# Patient Record
Sex: Female | Born: 1970 | Race: White | Hispanic: No | Marital: Single | Smoking: Never smoker
Health system: Southern US, Community
[De-identification: ages and names within clinical notes are randomized; demographics above are authoritative.]

## PROBLEM LIST (undated history)

## (undated) DIAGNOSIS — R519 Headache, unspecified: Secondary | ICD-10-CM

## (undated) DIAGNOSIS — F32A Depression, unspecified: Secondary | ICD-10-CM

## (undated) DIAGNOSIS — C439 Malignant melanoma of skin, unspecified: Secondary | ICD-10-CM

## (undated) DIAGNOSIS — A749 Chlamydial infection, unspecified: Secondary | ICD-10-CM

## (undated) DIAGNOSIS — F319 Bipolar disorder, unspecified: Secondary | ICD-10-CM

## (undated) DIAGNOSIS — F419 Anxiety disorder, unspecified: Secondary | ICD-10-CM

## (undated) DIAGNOSIS — F329 Major depressive disorder, single episode, unspecified: Secondary | ICD-10-CM

## (undated) DIAGNOSIS — F4310 Post-traumatic stress disorder, unspecified: Secondary | ICD-10-CM

## (undated) HISTORY — DX: Bipolar disorder, unspecified: F31.9

## (undated) HISTORY — DX: Major depressive disorder, single episode, unspecified: F32.9

## (undated) HISTORY — DX: Chlamydial infection, unspecified: A74.9

## (undated) HISTORY — DX: Anxiety disorder, unspecified: F41.9

## (undated) HISTORY — DX: Depression, unspecified: F32.A

## (undated) HISTORY — DX: Headache, unspecified: R51.9

## (undated) HISTORY — DX: Post-traumatic stress disorder, unspecified: F43.10

## (undated) HISTORY — DX: Malignant melanoma of skin, unspecified: C43.9

---
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
## 2004-10-01 ENCOUNTER — Encounter: Admission: RE | Admit: 2004-10-01 | Discharge: 2004-10-01 | Payer: Self-pay | Admitting: Family Medicine

## 2008-01-26 HISTORY — PX: REDUCTION MAMMAPLASTY: SUR839

## 2008-07-03 ENCOUNTER — Ambulatory Visit: Payer: Self-pay | Admitting: Family Medicine

## 2008-07-03 DIAGNOSIS — Z85820 Personal history of malignant melanoma of skin: Secondary | ICD-10-CM | POA: Insufficient documentation

## 2008-07-03 DIAGNOSIS — Z8619 Personal history of other infectious and parasitic diseases: Secondary | ICD-10-CM | POA: Insufficient documentation

## 2008-07-03 DIAGNOSIS — R5383 Other fatigue: Secondary | ICD-10-CM

## 2008-07-03 DIAGNOSIS — R5381 Other malaise: Secondary | ICD-10-CM | POA: Insufficient documentation

## 2008-07-17 ENCOUNTER — Ambulatory Visit: Payer: Self-pay | Admitting: Family Medicine

## 2008-07-23 ENCOUNTER — Other Ambulatory Visit: Admission: RE | Admit: 2008-07-23 | Discharge: 2008-07-23 | Payer: Self-pay | Admitting: Family Medicine

## 2008-07-23 ENCOUNTER — Encounter: Payer: Self-pay | Admitting: Family Medicine

## 2008-07-23 ENCOUNTER — Ambulatory Visit: Payer: Self-pay | Admitting: Family Medicine

## 2008-07-24 LAB — CONVERTED CEMR LAB
ALT: 16 units/L (ref 0–35)
AST: 19 units/L (ref 0–37)
Albumin: 3.2 g/dL — ABNORMAL LOW (ref 3.5–5.2)
Alkaline Phosphatase: 42 units/L (ref 39–117)
BUN: 11 mg/dL (ref 6–23)
Basophils Absolute: 0 10*3/uL (ref 0.0–0.1)
Basophils Relative: 0.1 % (ref 0.0–3.0)
Bilirubin, Direct: 0.1 mg/dL (ref 0.0–0.3)
CO2: 29 meq/L (ref 19–32)
Calcium: 8.2 mg/dL — ABNORMAL LOW (ref 8.4–10.5)
Chloride: 107 meq/L (ref 96–112)
Cholesterol: 190 mg/dL (ref 0–200)
Creatinine, Ser: 0.7 mg/dL (ref 0.4–1.2)
Eosinophils Absolute: 0.3 10*3/uL (ref 0.0–0.7)
Eosinophils Relative: 4.1 % (ref 0.0–5.0)
GFR calc non Af Amer: 98.68 mL/min (ref >60)
Glucose, Bld: 98 mg/dL (ref 70–99)
HCT: 38.9 % (ref 36.0–46.0)
HDL: 33.8 mg/dL — ABNORMAL LOW (ref >39.00)
Hemoglobin: 13.2 g/dL (ref 12.0–15.0)
LDL Cholesterol: 147 mg/dL — ABNORMAL HIGH (ref 0–99)
Lymphocytes Relative: 34.5 % (ref 12.0–46.0)
Lymphs Abs: 2.4 10*3/uL (ref 0.7–4.0)
MCHC: 34.1 g/dL (ref 30.0–36.0)
MCV: 88.2 fL (ref 78.0–100.0)
Monocytes Absolute: 0.5 10*3/uL (ref 0.1–1.0)
Monocytes Relative: 6.7 % (ref 3.0–12.0)
Neutro Abs: 3.7 10*3/uL (ref 1.4–7.7)
Neutrophils Relative %: 54.6 % (ref 43.0–77.0)
Platelets: 259 10*3/uL (ref 150.0–400.0)
Potassium: 4.2 meq/L (ref 3.5–5.1)
RBC: 4.41 M/uL (ref 3.87–5.11)
RDW: 12.2 % (ref 11.5–14.6)
Sodium: 141 meq/L (ref 135–145)
TSH: 1.24 microintl units/mL (ref 0.35–5.50)
Total Bilirubin: 0.9 mg/dL (ref 0.3–1.2)
Total CHOL/HDL Ratio: 6
Total Protein: 6.6 g/dL (ref 6.0–8.3)
Triglycerides: 45 mg/dL (ref 0.0–149.0)
VLDL: 9 mg/dL (ref 0.0–40.0)
Vitamin B-12: 282 pg/mL (ref 211–911)
WBC: 6.9 10*3/uL (ref 4.5–10.5)

## 2008-07-30 ENCOUNTER — Encounter (INDEPENDENT_AMBULATORY_CARE_PROVIDER_SITE_OTHER): Payer: Self-pay | Admitting: *Deleted

## 2008-07-30 LAB — CONVERTED CEMR LAB: Pap Smear: NORMAL

## 2008-09-17 ENCOUNTER — Ambulatory Visit: Payer: Self-pay | Admitting: Family Medicine

## 2008-09-17 LAB — CONVERTED CEMR LAB: Rapid Strep: NEGATIVE

## 2009-01-01 ENCOUNTER — Telehealth: Payer: Self-pay | Admitting: Family Medicine

## 2009-01-21 ENCOUNTER — Ambulatory Visit: Payer: Self-pay | Admitting: Family Medicine

## 2009-01-21 DIAGNOSIS — M545 Low back pain, unspecified: Secondary | ICD-10-CM | POA: Insufficient documentation

## 2009-01-21 DIAGNOSIS — H109 Unspecified conjunctivitis: Secondary | ICD-10-CM | POA: Insufficient documentation

## 2009-01-21 DIAGNOSIS — G44209 Tension-type headache, unspecified, not intractable: Secondary | ICD-10-CM | POA: Insufficient documentation

## 2009-01-21 DIAGNOSIS — M542 Cervicalgia: Secondary | ICD-10-CM | POA: Insufficient documentation

## 2009-01-25 HISTORY — PX: REDUCTION MAMMAPLASTY: SUR839

## 2009-01-25 HISTORY — PX: BREAST REDUCTION SURGERY: SHX8

## 2009-01-27 ENCOUNTER — Encounter: Payer: Self-pay | Admitting: Family Medicine

## 2009-02-14 ENCOUNTER — Ambulatory Visit: Payer: Self-pay | Admitting: Family Medicine

## 2009-02-14 DIAGNOSIS — N898 Other specified noninflammatory disorders of vagina: Secondary | ICD-10-CM | POA: Insufficient documentation

## 2009-02-14 LAB — CONVERTED CEMR LAB: KOH Prep: NEGATIVE

## 2009-05-28 ENCOUNTER — Telehealth: Payer: Self-pay | Admitting: Family Medicine

## 2009-06-24 ENCOUNTER — Encounter: Payer: Self-pay | Admitting: Family Medicine

## 2009-09-04 ENCOUNTER — Ambulatory Visit: Payer: Self-pay | Admitting: Family Medicine

## 2009-09-04 DIAGNOSIS — D72829 Elevated white blood cell count, unspecified: Secondary | ICD-10-CM | POA: Insufficient documentation

## 2009-09-04 DIAGNOSIS — B3749 Other urogenital candidiasis: Secondary | ICD-10-CM | POA: Insufficient documentation

## 2009-09-04 LAB — CONVERTED CEMR LAB
BUN: 6 mg/dL (ref 6–23)
Basophils Absolute: 0.1 10*3/uL (ref 0.0–0.1)
Basophils Relative: 0.5 % (ref 0.0–3.0)
CO2: 30 meq/L (ref 19–32)
Calcium: 8.4 mg/dL (ref 8.4–10.5)
Chloride: 103 meq/L (ref 96–112)
Creatinine, Ser: 0.6 mg/dL (ref 0.4–1.2)
Eosinophils Absolute: 0.2 10*3/uL (ref 0.0–0.7)
Eosinophils Relative: 1.5 % (ref 0.0–5.0)
Free T4: 0.91 ng/dL (ref 0.60–1.60)
GFR calc non Af Amer: 112.87 mL/min (ref >60)
Glucose, Bld: 80 mg/dL (ref 70–99)
HCT: 40 % (ref 36.0–46.0)
Hemoglobin: 13.5 g/dL (ref 12.0–15.0)
Hgb A1c MFr Bld: 5.7 % (ref 4.6–6.5)
Lymphocytes Relative: 17.6 % (ref 12.0–46.0)
Lymphs Abs: 2.6 10*3/uL (ref 0.7–4.0)
MCHC: 33.8 g/dL (ref 30.0–36.0)
MCV: 89 fL (ref 78.0–100.0)
Monocytes Absolute: 0.7 10*3/uL (ref 0.1–1.0)
Monocytes Relative: 4.5 % (ref 3.0–12.0)
Neutro Abs: 11.2 10*3/uL — ABNORMAL HIGH (ref 1.4–7.7)
Neutrophils Relative %: 75.9 % (ref 43.0–77.0)
Platelets: 297 10*3/uL (ref 150.0–400.0)
Potassium: 3.6 meq/L (ref 3.5–5.1)
RBC: 4.49 M/uL (ref 3.87–5.11)
RDW: 14.1 % (ref 11.5–14.6)
Sodium: 142 meq/L (ref 135–145)
T3, Free: 3 pg/mL (ref 2.3–4.2)
TSH: 1.12 microintl units/mL (ref 0.35–5.50)
WBC: 14.7 10*3/uL — ABNORMAL HIGH (ref 4.5–10.5)
Whiff Test: POSITIVE

## 2009-09-10 ENCOUNTER — Telehealth: Payer: Self-pay | Admitting: Family Medicine

## 2009-09-10 ENCOUNTER — Ambulatory Visit: Payer: Self-pay | Admitting: Family Medicine

## 2009-09-10 LAB — CONVERTED CEMR LAB
Basophils Absolute: 0.1 10*3/uL (ref 0.0–0.1)
Basophils Relative: 0.7 % (ref 0.0–3.0)
Eosinophils Absolute: 0.3 10*3/uL (ref 0.0–0.7)
Eosinophils Relative: 2.4 % (ref 0.0–5.0)
HCT: 41.1 % (ref 36.0–46.0)
Hemoglobin: 14.1 g/dL (ref 12.0–15.0)
Lymphocytes Relative: 24.5 % (ref 12.0–46.0)
Lymphs Abs: 2.9 10*3/uL (ref 0.7–4.0)
MCHC: 34.4 g/dL (ref 30.0–36.0)
MCV: 88.9 fL (ref 78.0–100.0)
Monocytes Absolute: 0.6 10*3/uL (ref 0.1–1.0)
Monocytes Relative: 5.4 % (ref 3.0–12.0)
Neutro Abs: 7.9 10*3/uL — ABNORMAL HIGH (ref 1.4–7.7)
Neutrophils Relative %: 67 % (ref 43.0–77.0)
Platelets: 311 10*3/uL (ref 150.0–400.0)
RBC: 4.62 M/uL (ref 3.87–5.11)
RDW: 14.6 % (ref 11.5–14.6)
WBC: 11.8 10*3/uL — ABNORMAL HIGH (ref 4.5–10.5)

## 2009-11-21 ENCOUNTER — Ambulatory Visit: Payer: Self-pay | Admitting: Family Medicine

## 2009-11-21 DIAGNOSIS — R42 Dizziness and giddiness: Secondary | ICD-10-CM | POA: Insufficient documentation

## 2009-11-21 DIAGNOSIS — R059 Cough, unspecified: Secondary | ICD-10-CM | POA: Insufficient documentation

## 2009-11-21 DIAGNOSIS — R05 Cough: Secondary | ICD-10-CM

## 2010-02-26 NOTE — Assessment & Plan Note (Signed)
Summary: FEELS WEAK, NO APPETITE   Vital Signs:  Patient profile:   40 year old female Height:      61.75 inches Weight:      209.2 pounds BMI:     38.71 Temp:     97.8 degrees F oral Pulse rate:   76 / minute Pulse rhythm:   118regular BP sitting:   118 / 72  (left arm) Cuff size:   large  Vitals Entered By: Benny Lennert CMA Duncan Dull) (September 04, 2009 8:42 AM) Is Patient Diabetic? No   History of Present Illness: Chief complaint feels weak and no appetite also ? yeast infection  40 year old female:  Had a breast reduction some time,   Works as a Oncologist.   Heat is outside. Has lost ten pounds in the last three or for weeks.  When eating, will upset her stomache.  Has had some shaky hands and legs, has had longstanding.  Drinking a lot  the patient feels shaky, is tired, has lost weight over the last 2 or 3 weeks. She also is urinating more than normally.  Craving soft drinks now.   had gestational diabets.   h/o : GDM  FH: thyroid.    she also complains of having some itchiness and discomfort in and around in her thigh and buttock areas with some increased watery stools recently. No vaginal discharge, but some discomfort in think that she may have the onset of a discharge.    Allergies (verified): No Known Drug Allergies  Past History:  Past medical, surgical, family and social histories (including risk factors) reviewed, and no changes noted (except as noted below).  Past Surgical History: Reviewed history from 07/03/2008 and no changes required. eye surgery for strabismus C-section  Family History: Reviewed history from 07/03/2008 and no changes required. mother: hypothyroid, kidney disease..had partial kidney removed age 29 father: died young age in accident PGM: CVA, MI PGF: DM, ? lung cancer MGM: lung cancer  Social History: Reviewed history from 07/03/2008 and no changes required. [employer] systems officer In long term monogomous  relationship, fiance Single 1 child, healthy Never Smoked Alcohol use-yes, monthy drinks 4 drinks Drug use-no Regular exercise-no Diet: fruits and veggies, fish  Review of Systems       as above  Physical Exam  General:  GEN: Well-developed,well-nourished,in no acute distress; alert,appropriate and cooperative throughout examination HEENT: Normocephalic and atraumatic without obvious abnormalities. No apparent alopecia or balding. Ears, externally no deformities PULM: Breathing comfortably in no respiratory distress EXT: No clubbing, cyanosis, or edema PSYCH: Normally interactive. Cooperative during the interview. Pleasant. Friendly and conversant. Not anxious or depressed appearing. Normal, full affect.  Lungs:  Normal respiratory effort, chest expands symmetrically. Lungs are clear to auscultation, no crackles or wheezes. Heart:  Normal rate and regular rhythm. S1 and S2 normal without gallop, murmur, click, rub or other extra sounds. Genitalia:  normal external genitalia, with some evidence of a flat macular rash externally. Positive whiff test.   Impression & Recommendations:  Problem # 1:  FATIGUE (ICD-780.79) likely multifactorial, could be endocrine base. Could be anemia, may all be due to heat, when the patient works outside all day.  Orders: Venipuncture (04540) TLB-BMP (Basic Metabolic Panel-BMET) (80048-METABOL) TLB-CBC Platelet - w/Differential (85025-CBCD) TLB-TSH (Thyroid Stimulating Hormone) (84443-TSH) TLB-A1C / Hgb A1C (Glycohemoglobin) (83036-A1C) TLB-T3, Free (Triiodothyronine) (84481-T3FREE) TLB-T4 (Thyrox), Free [phone])  Problem # 2:  VAGINAL DISCHARGE (ICD-623.5)  tree with oral Flagyl, bacterial vaginosis  Orders: Wet Prep (29562ZH)  Problem # 3:  CANDIDIASIS OF OTHER UROGENITAL SITES (ICD-112.2) Assessment: New topical nystatin  Complete Medication List: 1)  B Complex Tabs (B complex vitamins) .... As directed 2)  Multivitamins Tabs  (Multiple vitamin) .... Otc as directed. 3)  Nystatin 100000 Unit/gm Crea (Nystatin) .... Apply three times a day as directed 4)  Metronidazole 500 Mg Tabs (Metronidazole) .Marland Kitchen.. 1 by mouth two times a day Prescriptions: METRONIDAZOLE 500 MG  TABS (METRONIDAZOLE) 1 by mouth two times a day  #14 x 0   Entered and Authorized by:   Hannah Beat MD   Signed by:   Hannah Beat MD on 09/04/2009   Method used:   [facility]        [facility] [facility]       [address]       [place], [place]  [address]       Ph: [phone] or [phone]       Fax: [phone]   RxID:   [id number] NYSTATIN 100000 UNIT/GM CREA (NYSTATIN) Apply three times a day as directed  #45 grams x 0   Entered and Authorized by:   Hannah Beat MD   Signed by:   Hannah Beat MD on 09/04/2009   Method used:   [facility]        [id number]       [address]       [place], [place]  [address]       Ph: [phone] or [phone]       Fax: [phone]   RxID:   [id number]   Current Allergies (reviewed today): No known allergies   Laboratory Results    [facility] Source: vagina WBC/hpf: 1-5 Bacteria/hpf: rare Clue cells/hpf: many  Positive whiff Yeast/hpf: none Trichomonas/hpf: none

## 2010-02-26 NOTE — Letter (Signed)
Summary: Generic Letter  [place] [facility]  [address] [place], [place] [address]   Phone: [phone]  Fax: [phone]    01/27/2009  [facility] [address] [place], [place]  [address]    To Whom It May Concern;   Ms. Janice Ali is a 40 year old female patient of mine with tension headaches, chronic neck pain and chronic low back pain who is interested in breast reduction surgery. I believe many of her chronic health issues will be vastly improved if not resolved following a breast reduction.      Her current bra size is G or H. She has trouble finding bras that provide support. Her exercise is limited due to lack of breast support.   Please let me know if you have any further questions regarding this patient.      Sincerely,   Kerby Nora MD

## 2010-02-26 NOTE — Assessment & Plan Note (Signed)
Summary: COUGH,DIZZY/CLE   Vital Signs:  Patient profile:   40 year old female Weight:      205.25 pounds Temp:     98.4 degrees F oral Pulse rate:   64 / minute Pulse (ortho):   84 / minute Pulse rhythm:   regular BP sitting:   112 / 80  (left arm) BP standing:   116 / 80 Cuff size:   large  Vitals Entered By: Selena Batten Dance CMA Duncan Dull) (November 21, 2009 3:43 PM)  Serial Vital Signs/Assessments:  Time      Position  BP       Pulse  Resp  Temp     By 4:09 PM   Lying LA  118/82   86                    Kim Dance CMA (AAMA) 4:09 PM   Sitting   116/80   88                    Kim Dance CMA (AAMA) 4:09 PM   Standing  116/80   84                    Kim Dance CMA (AAMA)  CC: Cough x2-3 days   History of Present Illness: CC: cough, dizzy  Several week h/o dizziness mainly when reaching up like getting something out of cupboard.  Also noticing when standing up quickly.  Feels lightheaded, off balanced.  Lasts a second.  No vertigo.  No LOC.  No HA, vision changes, CP/tightness.  Cough for 2-3 days - deep, dry, intermittent.  worse at night, associated with AC on.  hasn't tried anything for this yet.  No congestion, RN, sinus pressure.  No PNDrip.  no fevers/chills, abd pain, n/v, rashes, myalgias or arthralgias.  Smoking 1/2 ppd.  Smoking started a few months ago.  No h/o allergies, asthma.  No sick contacts at home.  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  Neuro - Dr. Neale Burly for headaches.  Current Medications (verified): 1)  B Complex  Tabs (B Complex Vitamins) .... As Directed 2)  Multivitamins   Tabs (Multiple Vitamin) .... Otc As Directed.  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 No apparent alopecia or balding. Eyes:  No corneal or conjunctival inflammation noted. EOMI. Perrla.  Ears:  clear B TMS Nose:  no mucosal pallor.   Mouth:  slight dry mm. Neck:  No deformities, masses, or tenderness noted.  no bruits auscultated over entire carotids Lungs:  Normal respiratory effort, chest expands symmetrically. Lungs are clear to auscultation, no crackles or wheezes. Heart:  Normal rate and regular rhythm. S1 and S2 normal without gallop, murmur, click, rub or other extra sounds. Pulses:  2+ rad pulses, 2+ carotid pulses Extremities:  no edema Neurologic:  No cranial nerve deficits  noted. Station and gait are normal. DTRs are symmetrical throughout. Sensory, motor and coordinative functions appear intact.  no dysdiadochokinesia, negative romberg, no pronator drift, normal finger to nose, heel to shin, walks without imbalance, able to heel and toe walk, unable to reproduce sxs, no nystagmus. Skin:  Intact without suspicious lesions or rashes Psych:  full affect   Impression & Recommendations:  Problem # 1:  COUGH (ICD-786.2) doesnt sound viral, allergic, lungs clear.  ? if irritant from envirnmental exposure.  advised to wash beddings, keep AC and fans off at night.  could try antihistamine.  return if not improving.  pt declines cough suppressant for now.  Problem # 2:  DIZZINESS (ICD-780.4) unclear etiology.  neuro exam nonfocal today.  not vertigo or presyncope.  advised if worsening or if persisting to return for further evaluation.  doubt brainstem cyst  causing this, but may need to take closer look if not resolved.  ? dehydration as slightly dry on exam today.  encouraged fluid intake.  orthostatics negative.  Complete Medication List: 1)  B Complex Tabs (B complex vitamins) .... As directed 2)  Multivitamins Tabs (Multiple vitamin) .... Otc as directed.  Other Orders: Flu Vaccine 61yrs + (14782) Admin 1st Vaccine (95621)  Patient Instructions: 1)  For cough could be reaction to something in your environment at night.  Could try antihistamine.  Be aware of anything that could be causing it and remove it (?dander). 2)  For dizziness - if not improving, or if worsening please return to be seen.  I'm not sure what is causing this today. 3)  to meet you, call clinic with questions.   Orders Added: 1)  Est. Patient Level III [30865] 2)  Flu Vaccine 1yrs + [90658] 3)  Admin 1st Vaccine [90471]   Immunizations Administered:  Influenza Vaccine # 1:    Vaccine Type: Fluvax 3+    Site: right deltoid    Mfr: GlaxoSmithKline    Dose: 0.5 ml    Route: IM    Given by: Selena Batten Dance CMA (AAMA)    Exp. Date: 07/25/2010    Lot #: [id number]    VIS given: 08/19/09 version given November 21, 2009.  Flu Vaccine Consent Questions:    Do you have a history of severe allergic reactions to this vaccine? no    Any prior history of allergic reactions to egg and/or gelatin? no    Do you have a sensitivity to the preservative Thimersol? no    Do you have a past history of Guillan-Barre Syndrome? no    Do you currently have an acute febrile illness? no    Have you ever had a severe reaction to latex? no    Vaccine information given and explained to patient? yes    Are you currently pregnant? no   Immunizations Administered:  Influenza Vaccine # 1:    Vaccine Type: Fluvax 3+    Site: right deltoid    Mfr: GlaxoSmithKline    Dose: 0.5 ml    Route: IM    Given by: Janee Morn CMA (AAMA)    Exp. Date: 07/25/2010    Lot #: [id number]    VIS given:  08/19/09 version given November 21, 2009.  Current Allergies (reviewed today): No known allergies     Prevention & Chronic Care Immunizations   Influenza vaccine: Fluvax 3+  (11/21/2009)   Influenza vaccine due: 09/26/2010    Tetanus booster: 01/25/2006: given   Tetanus booster due: 01/26/2016    Pneumococcal  vaccine: Not documented  Other Screening   Pap smear: normal  (07/30/2008)   Pap smear due: 07/30/2009    Mammogram: Not documented   Smoking status: never  (07/03/2008)  Lipids   Total Cholesterol: 190  (07/17/2008)   LDL: 147  (07/17/2008)   LDL Direct: Not documented   HDL: 33.80  (07/17/2008)   Triglycerides: 45.0  (07/17/2008)

## 2010-02-26 NOTE — Progress Notes (Signed)
Summary: wants referral regarding headaches  Phone Note Call from Patient Call back at Home Phone [phone] Call back at Work Phone [phone]   Caller: Patient Call For: Kerby Nora MD Summary of Call: Patient says that she has now had the breast reduction and her back problems have resolved, but she is still having very intense headaches and having trouble with speech. She is asking if she can get a referral to see why she continues to have the headaches. Pref[place]. Please advise.  Initial call taken by: Melody Comas,  May 28, 2009 3:48 PM  Follow-up for Phone Call        referral sent to [facility]. Follow-up by: Kerby Nora MD,  May 30, 2009 12:58 PM  Additional Follow-up for Phone Call Additional follow up Details #1::        Dr Amelia Jo on 06/24/2009 at 10:15am. Additional Follow-up by: Carlton Adam,  Jun 02, 2009 10:21 AM

## 2010-02-26 NOTE — Progress Notes (Signed)
Summary: regarding blood work  Phone Note Call from Patient Call back at Work Phone [phone]   Caller: Patient Call For: Dr. Patsy Lager Summary of Call: Pt had repeat blood work done this morning, including a pheripheral smear, and she is asking what you are looking for, or trying to rule out.  Please advise. Initial call taken by: Lowella Petties CMA,  September 10, 2009 9:48 AM  Follow-up for Phone Call        Often people have a transient elevation of white blood cell count.  This is benign. If is trending lower, then that is reassuring. Peripheral smear, the pathologist looks at the blood cells, manually counts all, and is a far more accurate way to look at the types of blood cells and platelets.  If normal, nothing more needs to be done.  Ultimately, you do these things to make sure something else bad is happening: like a form of leukemia. You have to double check. Follow-up by: Hannah Beat MD,  September 10, 2009 4:42 PM  Additional Follow-up for Phone Call Additional follow up Details #1::        LMOM  Lowella Petties CMA  September 10, 2009 5:05 PM [facility] at work number.           Lowella Petties CMA  September 11, 2009 10:24 AM  Advised pt.  Her results are in chart, can you please review these and advise?  Thanks. Additional Follow-up by: Lowella Petties CMA,  September 11, 2009 11:06 AM    Additional Follow-up for Phone Call Additional follow up Details #2::    peripheral smear is not back. CBC is trending down, WBC approaching upper level of normal which is reassuring.   Follow-up by: Hannah Beat MD,  September 11, 2009 11:29 AM  Additional Follow-up for Phone Call Additional follow up Details #3:: Details for Additional Follow-up Action Taken: Advised pt, advised her that we will call her back when all results are in.            Lowella Petties CMA  September 11, 2009 11:34 AM    Appended Document: regarding blood work Dr.Kalissa Grays spoke to patient about results.Consuello Masse CMA

## 2010-02-26 NOTE — Letter (Signed)
Summary: Results Follow up Letter  [place] [facility]  [address] [place], [place] [address]   Phone: [phone]  Fax: [phone]    07/30/2008 MRN: [id number]    [facility] [address] [place], [place]  [address]    Dear Ms. [facility]ACE,  The following are the results of your recent test(s):  Test         Result    Pap Smear:        Normal __X___  Not Normal _____ Comments:  Yearly follow up is recommended. ______________________________________________________ Cholesterol: LDL(Bad cholesterol):         Your goal is less than:         HDL (Good cholesterol):       Your goal is more than: Comments:  ______________________________________________________ Mammogram:        Normal _____  Not Normal _____ Comments:  ___________________________________________________________________ Hemoccult:        Normal _____  Not normal _______ Comments:    _____________________________________________________________________ Other Tests:  STD's test negative.    We routinely do not discuss normal results over the telephone.  If you desire a copy of the results, or you have any questions about this information we can discuss them at your next office visit.   Sincerely,     Excell Seltzer, M.D.  AEB:lsf

## 2010-02-26 NOTE — Assessment & Plan Note (Signed)
Summary: F/U FROM LAST VISIT/CLE   Vital Signs:  Patient profile:   40 year old female Height:      61.75 inches Weight:      226.6 pounds BMI:     41.93 Temp:     98.2 degrees F oral Pulse rate:   80 / minute Pulse rhythm:   regular BP sitting:   110 / 80  (left arm) Cuff size:   large  Vitals Entered By: Benny Lennert CMA Duncan Dull) (February 14, 2009 3:14 PM)  History of Present Illness: Chief complaint wet prep ? fungal infection  Vaginal odor during menses..menses resolved, but still odor. Clear discharge, no itching. Rare abdominal pain, No N/V.   B eyes, right worse..mattering, crusty yellow discharge, occ watering, unconfortable. not ithcy. no sneezing, no hx of allergies.    Problems Prior to Update: 1)  Conjunctivitis, Viral  (ICD-077.99) 2)  Neck Pain, Chronic  (ICD-723.1) 3)  Low Back Pain, Chronic  (ICD-724.2) 4)  Headache, Tension  (ICD-307.81) 5)  Preventive Health Care  (ICD-V70.0) 6)  Screening For Lipoid Disorders  (ICD-V77.91) 7)  Fatigue  (ICD-780.79) 8)  Chlamydial Infection, Hx of  (ICD-V12.09) 9)  Hx of Melanoma  (ICD-172.9)  Current Medications (verified): 1)  B Complex  Tabs (B Complex Vitamins) .... As Directed 2)  Multivitamins   Tabs (Multiple Vitamin) .... Otc As Directed. 3)  Neomycin-Polymyxin-Hc 3.5-10000-1 Soln (Neomycin-Polymyxin-Hc) .Marland Kitchen.. 1 Gtt in Each Eye Three Times A Day X 5 Days 4)  Fluconazole 150 Mg Tabs (Fluconazole) .Marland Kitchen.. 1 Tab By Mouth X 1 Day  Allergies (verified): No Known Drug Allergies  Past History:  Past medical, surgical, family and social histories (including risk factors) reviewed, and no changes noted (except as noted below).  Past Surgical History: Reviewed history from 07/03/2008 and no changes required. eye surgery for strabismus C-section  Family History: Reviewed history from 07/03/2008 and no changes required. mother: hypothyroid, kidney disease..had partial kidney removed age 86 father: died young age in  accident PGM: CVA, MI PGF: DM, ? lung cancer MGM: lung cancer  Social History: Reviewed history from 07/03/2008 and no changes required. 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 Eyes:  Complains of eye irritation; denies double vision. ENT:  Complains of nasal congestion and postnasal drainage; denies earache and sinus pressure. CV:  Denies chest pain or discomfort. Resp:  Denies shortness of breath. GI:  Denies abdominal pain.  Physical Exam  General:  obses appearing female inNAd Eyes:  B slight erythema, no discharge.  Ears:  clear B TMS Nose:  nasal discharge, no mucosal pallor.   Mouth:  MMM Genitalia:  normal introitus and no external lesions, brownish vaginal discharge, no erythema   Impression & Recommendations:  Problem # 1:  CONJUNCTIVITIS, BILATERAL (ICD-372.30) If viral should have improved by now..treat with antibacterial steroid combo. TAke zyrtec for possible allergies. Call if not continue to improve.   Problem # 2:  VAGINAL DISCHARGE (ICD-623.5)  Rare yeast..no evidence of bacterial infection. Treat with diflucan x 1 . Call if not improving.   Orders: Wet Prep (51884ZY)  Complete Medication List: 1)  B Complex Tabs (B complex vitamins) .... As directed 2)  Multivitamins Tabs (Multiple vitamin) .... Otc as directed. 3)  Neomycin-polymyxin-hc 3.5-10000-1 Soln (Neomycin-polymyxin-hc) .Marland Kitchen.. 1 gtt in each eye three  times a day x 5 days 4)  Fluconazole 150 Mg Tabs (Fluconazole) .Marland Kitchen.. 1 tab by mouth x 1 day  Patient Instructions: 1)  Zyrtec for allergies. Eye drops as directed. 2)  Antifungal x 1 . Prescriptions: FLUCONAZOLE 150 MG TABS (FLUCONAZOLE) 1 tab by mouth x 1 day  #1 x 0   Entered and Authorized by:   Kerby Nora MD    Signed by:   Kerby Nora MD on 02/14/2009   Method used:   [facility] [id number]       [address]       [place], [place]  [address]       Ph: [phone] or [phone]       Fax: [phone]   RxID:   [id number] NEOMYCIN-POLYMYXIN-HC 3.5-10000-1 SOLN (NEOMYCIN-POLYMYXIN-HC) 1 gtt in each eye three times a day x 5 days  #1 x 0   Entered and Authorized by:   Kerby Nora MD   Signed by:   Kerby Nora MD on 02/14/2009   Method used:   [facility]        [facility] [id number]       [address]       [place], [place]  [address]       Ph: [phone] or [phone]       Fax: [phone]   RxID:   [id number]   Current Allergies (reviewed today): No known allergies   Laboratory Results    [place]/KOH Source: vag WBC/hpf rare Bacteria/hpf many Clue cells/hpf none Yeast/hpf occ KOH Negative Trichomonas/hpf neg

## 2010-02-26 NOTE — Progress Notes (Signed)
  Phone Note Call from Patient Call back at Home Phone [phone]   Caller: Patient Call For: Kerby Nora MD Summary of Call: Patient said that you told her to check with insurance about breast reduction and she did they will cover this but we have to find a surgeon that takes her insuranse Initial call taken by: Benny Lennert CMA Duncan Dull),  January 01, 2009 11:25 AM  Follow-up for Phone Call        Recommend having the patient defining this, not our staff. Follow-up by: Hannah Beat MD,  January 01, 2009 5:35 PM  Additional Follow-up for Phone Call Additional follow up Details #1::        Pt can let us know if she needs referral to specific surgeon once she has checked with insurance.  Additional Follow-up by: Kerby Nora MD,  January 02, 2009 11:21 AM    Additional Follow-up for Phone Call Additional follow up Details #2::    Patient advised.Consuello Masse CMA  Follow-up by: Benny Lennert CMA Duncan Dull),  January 02, 2009 12:42 PM

## 2010-02-26 NOTE — Consult Note (Signed)
Summary: [facility]  [facility]   Imported By: Lennie Odor 07/04/2009 15:11:22  _____________________________________________________________________  External Attachment:    Type:   Image     Comment:   External Document

## 2010-02-26 NOTE — Assessment & Plan Note (Signed)
Summary: STREP TEST/DLO   Vital Signs:  Patient profile:   40 year old female Height:      61.75 inches Weight:      223.50 pounds BMI:     41.36 Temp:     99 degrees F oral Pulse rate:   96 / minute Pulse rhythm:   regular BP sitting:   114 / 70  (left arm) Cuff size:   large  Vitals Entered By: Lewanda Rife LPN (September 17, 2008 12:08 PM)  CC:  ? strep throat and sorethroat and diarrhea.  History of Present Illness: Here for ST and diarrhea --diarrhea x 1wk--last stool today--loose now, eating regular diet-- --ST x 2d-- --exposed to strep 2 wks ago   Allergies (verified): No Known Drug Allergies  Review of Systems      See HPI  Physical Exam  General:  alert, well-developed, well-nourished, and well-hydrated.  NAD Eyes:  pupils equal, pupils round, and no injection.   Ears:  TMs retracted with increased fluid Nose:  no airflow obstruction, mucosal erythema, and mucosal edema.  sinuses +,- Mouth:  no exudates and pharyngeal erythema.   Lungs:  moist cough only, no crackles and no wheezes.   Neurologic:  alert & oriented X3 and gait normal.   Cervical Nodes:  1 cm tonsilar nodes, no anterior cervical adenopathy and no posterior cervical adenopathy.   Psych:  normally interactive and good eye contact.     Impression & Recommendations:  Problem # 1:  URI (ICD-465.9) Assessment New continue comfort care measures: increase po fluids, rest, tylenol or IBP as needed will start on zpac gargle freq see back if not improved 5-7d diet should be bland, low fat until stool solid again  Complete Medication List: 1)  Vitamin E 600 Unit Caps (Vitamin e) .... Otc as directed. 2)  Multivitamins Tabs (Multiple vitamin) .... Otc as directed. 3)  Zithromax Z-pak 250 Mg Tabs (Azithromycin) .... Use as directed Prescriptions: ZITHROMAX Z-PAK 250 MG TABS (AZITHROMYCIN) use as directed  #1 x 0   Entered and Authorized by:   Gildardo Griffes FNP   Signed by:   Gildardo Griffes FNP on 09/17/2008   Method used:   [id number]       [address]       [place], [place]  [address]       Ph: [phone] or [phone]       Fax: [phone]   RxID:   [id number]   Current Allergies (reviewed today): No known allergies   Laboratory Results  Date/Time Received: September 17, 2008 12:10 PM  Date/Time Reported: September 17, 2008 12:10 PM   Other Tests  Rapid Strep: negative  Kit Test Internal QC: Positive   (Normal Range: Negative)

## 2010-02-26 NOTE — Assessment & Plan Note (Signed)
Summary: new patient, has migraines/bir   Vital Signs:  Patient profile:   40 year old female Height:      61.75 inches Weight:      217.0 pounds BMI:     40.16 Temp:     97.6 degrees F oral Pulse rate:   72 / minute Pulse rhythm:   regular BP sitting:   110 / 80  (left arm) Cuff size:   large  Vitals Entered By: Benny Lennert CMA (July 03, 2008 10:23 AM)  History of Present Illness: Chief complaint new patient to be established   Seen in 01/2008 for vaginal odor diagnosed with yeast infection chlamydia.  Has not had pap smear in a while, overdue for breast exam. Now vaginal odor has returned.  Some urinary frequency. Occ low abdominal pinching.   Some decreased energy, increased stress.  Weight gsain...but working on weight loss.  Preventive Screening-Counseling & Management  Alcohol-Tobacco     Smoking Status: never  Caffeine-Diet-Exercise     Does Patient Exercise: no      Drug Use:  no.    Current Medications (verified): 1)  None  Allergies (verified): No Known Drug Allergies  Past History:  Past medical, surgical, family and social histories (including risk factors) reviewed, and no changes noted (except as noted below).  Past Surgical History: eye surgery for strabismus C-section  Family History: Reviewed history and no changes required. mother: hypothyroid, kidney disease..had partial kidney removed age 64 father: died young age in accident PGM: CVA, MI PGF: DM, ? lung cancer MGM: lung cancer  Social History: Reviewed history and no changes required. 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 CV:  Denies chest pain or discomfort. Resp:  Denies  shortness of breath. GI:  Complains of constipation; denies abdominal pain and diarrhea. GU:  Denies abnormal vaginal bleeding and dysuria; menses last week. Neuro:  hands fall asleep a lot. Psych:  Complains of easily angered and irritability; denies anxiety, depression, and easily tearful.  Physical Exam  General:  overwieght appearing female in NAD Ears:  External ear exam shows no significant lesions or deformities.  Otoscopic examination reveals clear canals, tympanic membranes are intact bilaterally without bulging, retraction, inflammation or discharge. Hearing is grossly normal bilaterally. Nose:  External nasal examination shows no deformity or inflammation. Nasal mucosa are pink and moist without lesions or exudates. Mouth:  Oral mucosa and oropharynx without lesions or exudates.  Teeth in good repair. MMM Neck:  no carotid bruit or thyromegaly no cervical or supraclavicular lymphadenopathy  Lungs:  Normal respiratory effort, chest expands symmetrically. Lungs are clear to auscultation, no crackles or wheezes. Heart:  Normal rate and regular rhythm. S1 and S2 normal without gallop, murmur, click, rub or other extra sounds. Abdomen:  Bowel sounds positive,abdomen soft and mildly tender in LLQ without masses, organomegaly or hernias noted.no rebound tenderness Pulses:  R and L posterior tibial pulses are full and equal bilaterally  Extremities:  no edema Skin:  Intact without suspicious lesions or rashes Psych:  Cognition and judgment appear intact. Alert and cooperative with normal attention span and concentration. No apparent delusions, illusions, hallucinations   Impression & Recommendations:  Problem # 1:  FATIGUE (ICD-780.79) Will evaluate for  secondary causes with labs given other symptomotology.   Problem # 2:  ABDOMINAL PAIN, LEFT LOWER QUADRANT (ICD-789.04) Mild...? over ovary due to recent menses or from constipation. Increase fluids, fiber and exercise.   Problem # 3:   Preventive Health Care (ICD-V70.0) Assessment: Comment Only Due for CPE.Marland KitchenNeed chart ASAP/STAT to check pap and eval vaginal odor. Liekly need to retest GC/CHlam again on pap. Refused other STD testing.   Patient Instructions: 1)  Scheduled CPE, PAP in next  2)  Fasting lipids, CMET Dx v77.91, TSh, cbc, B12 789.09  Prior Medications (reviewed today): None Current Allergies (reviewed today): No known allergies   TD Result Date:  01/25/2006 TD Result:  given TD Next Due:  10 yr

## 2010-02-26 NOTE — Assessment & Plan Note (Signed)
Summary: CPX/PAP/DLO   Vital Signs:  Patient profile:   40 year old female LMP:     07/11/2008 Height:      61.75 inches Weight:      217 pounds BMI:     40.16 Temp:     99.4 degrees F oral Pulse rate:   76 / minute Pulse rhythm:   regular BP sitting:   108 / 82  (left arm) Cuff size:   large  Vitals Entered By: Delilah Shan (July 23, 2008 3:35 PM) CC: CPX - Pap LMP (date): 07/11/2008  years   days  Enter LMP: 07/11/2008   History of Present Illness: Has history of GC /Chlam in past...has had some recent vaginal discharge..interested in rechecking GC/chlam. No further abdominal pain and only minimal discharge.   Has continued fatigue, but some improvement.   Problems Prior to Update: 1)  Screening For Lipoid Disorders  (ICD-V77.91) 2)  Abdominal Pain, Left Lower Quadrant  (ICD-789.04) 3)  Fatigue  (ICD-780.79) 4)  Chlamydial Infection, Hx of  (ICD-V12.09) 5)  Hx of Melanoma  (ICD-172.9)  Current Medications (verified): 1)  None  Allergies (verified): No Known Drug Allergies  Past History:  Past medical, surgical, family and social histories (including risk factors) reviewed, and no changes noted (except as noted below).  Past Surgical History: Reviewed history from 07/03/2008 and no changes required. eye surgery for strabismus C-section  Family History: Reviewed history from 07/03/2008 and no changes required. mother: hypothyroid, kidney disease..had partial kidney removed age 36 father: died young age in accident PGM: CVA, MI PGF: DM, ? lung cancer MGM: lung cancer  Social History: Reviewed history from 07/03/2008 and no changes required. 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 CV:  Denies chest pain or discomfort. Resp:  Denies  shortness of breath. GI:  Denies abdominal pain, bloody stools, constipation, and diarrhea. GU:  Denies abnormal vaginal bleeding, discharge, and dysuria.  Physical Exam  General:  morbidly obeses female in NAD Eyes:  No corneal or conjunctival inflammation noted. EOMI. Perrla. Funduscopic exam benign, without hemorrhages, exudates or papilledema. Vision grossly normal. Ears:  External ear exam shows no significant lesions or deformities.  Otoscopic examination reveals clear canals, tympanic membranes are intact bilaterally without bulging, retraction, inflammation or discharge. Hearing is grossly normal bilaterally. Nose:  External nasal examination shows no deformity or inflammation. Nasal mucosa are pink and moist without lesions or exudates. Mouth:  Oral mucosa and oropharynx without lesions or exudates.  Teeth in good repair. Neck:  no carotid bruit or thyromegaly no cervical or supraclavicular lymphadenopathy  Breasts:  large pendulous breasts, no masses palpated, no nipple or skin abnormalities Lungs:  Normal respiratory effort, chest expands symmetrically. Lungs are clear to auscultation, no crackles or wheezes. Heart:  Normal rate and regular rhythm. S1 and S2 normal without gallop, murmur, click, rub or other extra sounds. Abdomen:  Bowel sounds positive,abdomen soft and non-tender without masses, organomegaly or hernias noted. Genitalia:  Pelvic Exam:        External: normal female genitalia without lesions or masses        Vagina: normal without lesions or masses        Cervix: normal without lesions or masses        Adnexa: normal bimanual exam without masses or fullness  Uterus: normal by palpation        Pap smear: performed Pulses:  R and L posterior tibial pulses are full and equal bilaterally  Extremities:  no edema   Impression & Recommendations:  Problem # 1:  Preventive Health Care (ICD-V70.0) Reviewed preventive care protocols, scheduled due services, and  updated immunizations. Encouraged exercise, weight loss, healthy eating habits.  Start calcium and vit D.   Problem # 2:  FATIGUE (ICD-780.79) Increase vit B12 . Work on [employer] and weight loss. Otherwise labs normal.   Patient Instructions: 1)  Calcium and vit D 600 mg/400 International Units once or twice daily. 2)  Start B12 in multivitamin or separate supplement.   Prior Medications (reviewed today): None Current Allergies (reviewed today): No known allergies

## 2016-01-12 ENCOUNTER — Encounter: Payer: Self-pay | Admitting: Primary Care

## 2016-01-12 ENCOUNTER — Ambulatory Visit (INDEPENDENT_AMBULATORY_CARE_PROVIDER_SITE_OTHER): Payer: PRIVATE HEALTH INSURANCE | Admitting: Primary Care

## 2016-01-12 VITALS — BP 102/70 | HR 74 | Temp 97.4°F | Ht 61.0 in | Wt 183.0 lb

## 2016-01-12 DIAGNOSIS — D229 Melanocytic nevi, unspecified: Secondary | ICD-10-CM | POA: Diagnosis not present

## 2016-01-12 DIAGNOSIS — Z85820 Personal history of malignant melanoma of skin: Secondary | ICD-10-CM

## 2016-01-12 DIAGNOSIS — Z1231 Encounter for screening mammogram for malignant neoplasm of breast: Secondary | ICD-10-CM

## 2016-01-12 DIAGNOSIS — F3177 Bipolar disorder, in partial remission, most recent episode mixed: Secondary | ICD-10-CM | POA: Diagnosis not present

## 2016-01-12 DIAGNOSIS — Z1239 Encounter for other screening for malignant neoplasm of breast: Secondary | ICD-10-CM

## 2016-01-12 DIAGNOSIS — F319 Bipolar disorder, unspecified: Secondary | ICD-10-CM | POA: Insufficient documentation

## 2016-01-12 NOTE — Assessment & Plan Note (Signed)
Numerous Nevi. Nevi in question appears benign, but considering history will refer. Referral sent to dermatology for full body check.

## 2016-01-12 NOTE — Patient Instructions (Signed)
Schedule your mammogram with the [facility] in [place].  Please schedule a physical with me at your convenience. You may also schedule a lab only appointment 3-4 days prior. We will discuss your lab results in detail during your physical.  Stop by the front desk and speak with either Rosaria Ferries or Robin regarding your referral to Dermatology.  It was a pleasure to meet you today! Please don't hesitate to call me with any questions. Welcome to [facility]!

## 2016-01-12 NOTE — Progress Notes (Signed)
   Subjective:    Patient ID: Janice Ali, female    DOB: 12-29-1970, 46 y.o.   MRN: [id number]  HPI  Janice Ali is a 45 year old female who presents today to establish care and discuss the problems mentioned below. Will obtain old records. Her last physical was several years ago.   1) Anxiety and Depression, PTSD, Bipolar Disorder: Diagnosed since August 2017. Currently managed on Paxil 40 mg, Geodon 40 mg, Alprazolam 0.25 mg, and Trazodone 50 mg. She is currently following with [facility] monthly and a therapist in [place] once weekly. Overall she feels well managed.  2) Nevi: History of Melanoma and was once following with Dermatology. She has noticed several newer moles; one to her right lower extremity and the other to her right lower extremity that has been present for several weeks. She has noticed changes in size. She would like to see a dermatologist for further evaluation. She denies unexplained weight loss, open lesions, itching, pain.  Review of Systems  Constitutional: Negative for unexpected weight change.  Respiratory: Negative for shortness of breath.   Cardiovascular: Negative for chest pain.  Skin:       Nevi  Psychiatric/Behavioral: Negative for sleep disturbance and suicidal ideas. The patient is not nervous/anxious.        Past Medical History:  Diagnosis Date  . Anxiety and depression   . Bipolar disorder ([place])   . Post traumatic stress disorder (PTSD)      Social History   Social History  . Marital status: Divorced    Spouse name: N/A  . Number of children: N/A  . Years of education: N/A   Occupational History  . Not on file.   Social History Main Topics  . Smoking status: Never Smoker  . Smokeless tobacco: Not on file  . Alcohol use Yes  . Drug use: Unknown  . Sexual activity: Not on file   Other Topics Concern  . Not on file   Social History Narrative   Single.   1 son.   Works in [employer].   Enjoys gardening.     Past  Surgical History:  Procedure Laterality Date  . BREAST REDUCTION SURGERY Bilateral 2011  . CESAREAN SECTION    . EYE SURGERY  1977    Family History  Problem Relation Age of Onset  . Hypothyroidism Mother   . Hypertension Mother   . Lung cancer Maternal Grandmother     Allergies  Allergen Reactions  . Codeine Itching  . Latex Itching    No current outpatient prescriptions on file prior to visit.   No current facility-administered medications on file prior to visit.     BP 102/70   Pulse 74   Temp 97.4 F (36.3 C) (Oral)   Ht 5\' 1"  (1.549 m)   Wt 183 lb (83 kg)   LMP 12/28/2015 (Within Days)   SpO2 98%   BMI 34.58 kg/m    Objective:   Physical Exam  Constitutional: She appears well-nourished.  Neck: Neck supple.  Cardiovascular: Normal rate and regular rhythm.   Pulmonary/Chest: Effort normal and breath sounds normal.  Skin: Skin is warm and dry.  Numerous nevi to entire body.  Nevus in question with fleshy color, circular, regular border.  Psychiatric: She has a normal mood and affect.          Assessment & Plan:

## 2016-01-12 NOTE — Progress Notes (Signed)
Pre visit review using our clinic review tool, if applicable. No additional management support is needed unless otherwise documented below in the visit note. 

## 2016-01-12 NOTE — Assessment & Plan Note (Signed)
Currently following through [facility], also seeing therapist. Stable on current regimen. Continue to follow with psychiatry.

## 2016-01-14 ENCOUNTER — Encounter: Payer: Self-pay | Admitting: Primary Care

## 2016-01-14 ENCOUNTER — Other Ambulatory Visit (HOSPITAL_COMMUNITY)
Admission: RE | Admit: 2016-01-14 | Discharge: 2016-01-14 | Disposition: A | Discharge status: Home / Self Care | Payer: Managed Care, Other (non HMO) | Source: Ambulatory Visit | Attending: Primary Care | Admitting: Primary Care

## 2016-01-14 ENCOUNTER — Ambulatory Visit (INDEPENDENT_AMBULATORY_CARE_PROVIDER_SITE_OTHER): Payer: PRIVATE HEALTH INSURANCE | Admitting: Primary Care

## 2016-01-14 VITALS — BP 106/78 | HR 67 | Temp 98.2°F | Ht 61.0 in | Wt 183.0 lb

## 2016-01-14 DIAGNOSIS — Z124 Encounter for screening for malignant neoplasm of cervix: Secondary | ICD-10-CM | POA: Diagnosis not present

## 2016-01-14 DIAGNOSIS — Z01419 Encounter for gynecological examination (general) (routine) without abnormal findings: Secondary | ICD-10-CM | POA: Diagnosis present

## 2016-01-14 DIAGNOSIS — Z1151 Encounter for screening for human papillomavirus (HPV): Secondary | ICD-10-CM | POA: Diagnosis present

## 2016-01-14 DIAGNOSIS — Z0000 Encounter for general adult medical examination without abnormal findings: Secondary | ICD-10-CM | POA: Insufficient documentation

## 2016-01-14 LAB — COMPREHENSIVE METABOLIC PANEL
ALT: 13 U/L (ref 0–35)
AST: 14 U/L (ref 0–37)
Albumin: 3.9 g/dL (ref 3.5–5.2)
Alkaline Phosphatase: 46 U/L (ref 39–117)
BUN: 14 mg/dL (ref 6–23)
CO2: 29 mEq/L (ref 19–32)
Calcium: 9.2 mg/dL (ref 8.4–10.5)
Chloride: 101 mEq/L (ref 96–112)
Creatinine, Ser: 0.7 mg/dL (ref 0.40–1.20)
GFR: 95.27 mL/min (ref >60.00)
Glucose, Bld: 91 mg/dL (ref 70–99)
Potassium: 4 mEq/L (ref 3.5–5.1)
Sodium: 138 mEq/L (ref 135–145)
Total Bilirubin: 0.3 mg/dL (ref 0.2–1.2)
Total Protein: 6.6 g/dL (ref 6.0–8.3)

## 2016-01-14 LAB — LIPID PANEL
Cholesterol: 174 mg/dL (ref 0–200)
HDL: 44.6 mg/dL
LDL Cholesterol: 97 mg/dL (ref 0–99)
NonHDL: 129.08
Total CHOL/HDL Ratio: 4
Triglycerides: 159 mg/dL — ABNORMAL HIGH (ref 0.0–149.0)
VLDL: 31.8 mg/dL (ref 0.0–40.0)

## 2016-01-14 LAB — HEMOGLOBIN A1C: Hgb A1c MFr Bld: 5.4 % (ref 4.6–6.5)

## 2016-01-14 NOTE — Progress Notes (Signed)
Subjective:    Patient ID: Janice Ali, female    DOB: 11-10-1970, 45 y.o.   MRN: [id number]  HPI  [name] is a 45 year old female who presents today for complete physical.  Immunizations: -Tetanus: Completed in 2008, due today. -Influenza: Completed in October 2017   Diet: She endorses a fair diet. Breakfast: Fruit, eggs, toast Lunch: Salad, vegetable, meat, fast food Dinner: Meat, vegetable, soup, fruit Snacks: Fruit, chocolate  Desserts: Several times weekly Beverages: Water, milk, soda  Exercise: She does not currently exercise. Eye exam: Completed 4 years ago. Dental exam: Completed yesterday. Pap Smear: Completed 3+ years ago. Mammogram: Ordered and pending.   Review of Systems  Constitutional: Negative for unexpected weight change.  HENT: Negative for rhinorrhea.   Respiratory: Negative for cough and shortness of breath.   Cardiovascular: Negative for chest pain.  Gastrointestinal: Negative for constipation and diarrhea.  Genitourinary: Negative for difficulty urinating and menstrual problem.  Musculoskeletal: Negative for arthralgias and myalgias.  Skin: Negative for rash.  Allergic/Immunologic: Negative for environmental allergies.  Neurological: Negative for dizziness, numbness and headaches.  Psychiatric/Behavioral:       Feels well managed through [facility].       Past Medical History:  Diagnosis Date  . Anxiety and depression   . Bipolar disorder ([place])   . Chlamydia   . Melanoma ([place])   . Post traumatic stress disorder (PTSD)      Social History   Social History  . Marital status: Divorced    Spouse name: N/A  . Number of children: N/A  . Years of education: N/A   Occupational History  . Not on file.   Social History Main Topics  . Smoking status: Never Smoker  . Smokeless tobacco: Not on file  . Alcohol use Yes  . Drug use: Unknown  . Sexual activity: Not on file   Other Topics Concern  . Not on file   Social  History Narrative   Single.   1 son.   Works in [employer].   Enjoys gardening.     Past Surgical History:  Procedure Laterality Date  . BREAST REDUCTION SURGERY Bilateral 2011  . CESAREAN SECTION    . EYE SURGERY  1977    Family History  Problem Relation Age of Onset  . Hypothyroidism Mother   . Hypertension Mother   . Lung cancer Maternal Grandmother     Allergies  Allergen Reactions  . Codeine Itching  . Latex Itching    Current Outpatient Prescriptions on File Prior to Visit  Medication Sig Dispense Refill  . ALPRAZolam (XANAX) 0.25 MG tablet Take 0.25 mg by mouth as needed for anxiety.    Marland Kitchen CALCIUM-VITAMIN D PO Take by mouth.    . Multiple Vitamin (MULTIVITAMIN) capsule Take 1 capsule by mouth daily.    Marland Kitchen PARoxetine (PAXIL) 40 MG tablet Take 40 mg by mouth at bedtime.    . traZODone (DESYREL) 50 MG tablet Take 50 mg by mouth at bedtime.    . ziprasidone (GEODON) 40 MG capsule Take 40 mg by mouth 2 (two) times daily with a meal. Taking 40 mg in the morning and 80 mg at bedtime     No current facility-administered medications on file prior to visit.     BP 106/78   Pulse 67   Temp 98.2 F (36.8 C) (Oral)   Ht 5\' 1"  (1.549 m)   Wt 183 lb (83 kg)   LMP 12/28/2015 (Within Days)  SpO2 95%   BMI 34.58 kg/m    Objective:   Physical Exam  Constitutional: She is oriented to person, place, and time. She appears well-nourished.  HENT:  Right Ear: Tympanic membrane and ear canal normal.  Left Ear: Tympanic membrane and ear canal normal.  Nose: Nose normal.  Mouth/Throat: Oropharynx is clear and moist.  Eyes: Conjunctivae and EOM are normal. Pupils are equal, round, and reactive to light.  Neck: Neck supple. No thyromegaly present.  Cardiovascular: Normal rate and regular rhythm.   No murmur heard. Pulmonary/Chest: Effort normal and breath sounds normal. She has no rales.  Abdominal: Soft. Bowel sounds are normal. There is no tenderness.  Musculoskeletal: Normal  range of motion.  Lymphadenopathy:    She has no cervical adenopathy.  Neurological: She is alert and oriented to person, place, and time. She has normal reflexes. No cranial nerve deficit.  Skin: Skin is warm and dry. No rash noted.  Psychiatric: She has a normal mood and affect.          Assessment & Plan:

## 2016-01-14 NOTE — Patient Instructions (Signed)
We will notify you of your pap results once received.  Complete your mammogram as scheduled.  Complete lab work prior to leaving today. I will notify you of your results once received.   It's importance to improve your diet by reducing consumption of fast food, fried food, processed snack foods, sugary drinks. Increase consumption of fresh vegetables and fruits, whole grains, water.  Ensure you are drinking 64 ounces of water daily.  Start exercising. You should be getting 150 minutes of moderate intensity exercise weekly.  Follow up in 1 year for your annual physical or sooner if needed.  It was a pleasure to see you today!

## 2016-01-14 NOTE — Progress Notes (Signed)
Pre visit review using our clinic review tool, if applicable. No additional management support is needed unless otherwise documented below in the visit note. 

## 2016-01-14 NOTE — Assessment & Plan Note (Signed)
Tetanus due, provided today. Influenza vaccination UTD. Mammogram due, scheduled and pending. Pap due, completed today. Exam unremarkable. Labs pending. Discussed the importance of a healthy diet and regular exercise in order for weight loss, and to reduce the risk of other medical diseases. Follow up in 1 year for annual exam.

## 2016-01-15 ENCOUNTER — Encounter: Payer: Self-pay | Admitting: *Deleted

## 2016-01-16 LAB — CYTOLOGY - PAP
Diagnosis: NEGATIVE
HPV: NOT DETECTED

## 2016-01-20 ENCOUNTER — Encounter: Payer: Self-pay | Admitting: *Deleted

## 2016-01-20 ENCOUNTER — Ambulatory Visit
Admission: RE | Admit: 2016-01-20 | Discharge: 2016-01-20 | Disposition: A | Discharge status: Home / Self Care | Payer: No Typology Code available for payment source | Source: Ambulatory Visit | Attending: Primary Care | Admitting: Primary Care

## 2016-01-20 DIAGNOSIS — Z1231 Encounter for screening mammogram for malignant neoplasm of breast: Secondary | ICD-10-CM | POA: Insufficient documentation

## 2016-01-20 DIAGNOSIS — Z1239 Encounter for other screening for malignant neoplasm of breast: Secondary | ICD-10-CM

## 2016-02-16 ENCOUNTER — Ambulatory Visit: Payer: PRIVATE HEALTH INSURANCE

## 2016-06-23 ENCOUNTER — Telehealth: Payer: Self-pay

## 2016-06-23 NOTE — Telephone Encounter (Signed)
Pt left v/m requesting a prenatal vitamin with folic acid and zinc; pt is not pregnant but hospital called in this particular vitamin and pt wants to continue; multi vitamin is on med list.[facility] [address]. Pt request cb when refilled.

## 2016-06-23 NOTE — Telephone Encounter (Signed)
Noted  

## 2016-06-23 NOTE — Telephone Encounter (Signed)
Please get details for the prenatal vitamin as it is not on her med list. What brand? What does it say on the bottle?

## 2016-06-23 NOTE — Telephone Encounter (Signed)
Patient notified as instructed by telephone and was advised that the bottle only shows Prenatal vitamin and [facility] listed on it. Patient stated that she will call the pharmacy in [place] where it was filled and see if she can get more information and bring the bottle to the office for Anda Kraft to see. Patient will be in contact tomorrow hopefully with more information.

## 2016-06-28 ENCOUNTER — Telehealth: Payer: Self-pay

## 2016-06-28 NOTE — Telephone Encounter (Signed)
Please call [facility] on [address] and notify them that she would like a prescription for the prenatal vitamins transferred from [facility] in [place]. I'll authorize refills, #90, 1 refill. Take 1 tablet by mouth daily. You will have to give them a prescription number and likely the in [facility] number

## 2016-06-28 NOTE — Telephone Encounter (Signed)
I have spoken to the pharmacist at [facility] and given the information regarding the transfer. He stated that he will call [facility] and transfer the vitamins to [facility].   I called the patient and inform her what was done.

## 2016-06-28 NOTE — Telephone Encounter (Signed)
Pt wants prenatal vitamin; I spoke with Thurmond Butts at [facility] in [place]. The Rx # [id number]; qty # 99. The name is prenatal vitamin by [employer]; [place] # [id number]. Pt wants med [facility] to [facility] [address] and pt request cb when sent to pharmacy.

## 2016-11-26 ENCOUNTER — Telehealth: Payer: Self-pay

## 2016-11-26 NOTE — Telephone Encounter (Signed)
Pt called for info on the clinic. Info given. Pt verbalized understanding.

## 2017-01-21 ENCOUNTER — Encounter: Payer: PRIVATE HEALTH INSURANCE | Admitting: Primary Care

## 2019-10-24 ENCOUNTER — Telehealth: Payer: Self-pay

## 2019-10-24 NOTE — Telephone Encounter (Signed)
[name] TELEPHONE ADVICE RECORD [facility] Patient Name: Janice Ali Gender: Female DOB: 01/06/1971 Age: 49 Y 61 M Return Phone Number: [phone] (Primary) Address: City/State/Zip: [name] Client [name] Client Site [facility] - Day Physician Alma Friendly - NP Contact Type Call Who Is Calling Patient / Member / Family / Caregiver Call Type Triage / Clinical Relationship To Patient Self Return Phone Number [phone] (Primary) Chief Complaint CHEST PAIN (>=21 years) - pain, pressure, heaviness or tightness Reason for Call Symptomatic / Request for [place] states she has chest tightness, and wondering what to do. Translation No Nurse Assessment Nurse: [name], RN, [name] Date/Time [name] Time): 10/24/2019 2:30:42 PM Confirm and document reason for call. If symptomatic, describe symptoms. ---Caller states she has been having chest tightness for a couple weeks. It is pretty constant Does the patient have any new or worsening symptoms? ---Yes Will a triage be completed? ---Yes Related visit to physician within the last 2 weeks? ---No Does the PT have any chronic conditions? (i.e. diabetes, asthma, this includes High risk factors for pregnancy, etc.) ---No Is the patient pregnant or possibly pregnant? (Ask all females between the ages of 70-55) ---No Is this a behavioral health or substance abuse call? ---No Guidelines Guideline Title Affirmed Question Affirmed Notes Nurse Date/Time [name] Time) Chest Pain [1] Chest pain lasts > 5 minutes AND [2] age > 52 [name], RN, [name] 10/24/2019 2:31:38 PM Disp. Time [name] Time) Disposition Final User 10/24/2019 2:27:51 PM Send to Urgent Queue [name] 10/24/2019 2:35:35 PM 911 Outcome Documentation [name], RN, [name] Reason: Caller refuses to call 911 and  states she will be going to work tonight and just wants to make an appt. I advised that we recommend she seek immediate medical attention and she declined PLEASE NOTE: All timestamps contained within this report are represented as [place] Standard Time. CONFIDENTIALTY NOTICE: This fax transmission is intended only for the addressee. It contains information that is legally privileged, confidential or otherwise protected from use or disclosure. If you are not the intended recipient, you are strictly prohibited from reviewing, disclosing, copying using or disseminating any of this information or taking any action in reliance on or regarding this information. If you have received this fax in error, please notify us immediately by telephone so that we can arrange for its return to [place]. Phone: [phone], Toll-Free: [phone], Fax: [phone] Page: 2 of 2 Call Id: [id number] 10/24/2019 2:34:06 PM Call EMS 911 Now Yes [name], RN, [name] Disagree/Comply Disagree Caller Understands Yes PreDisposition Did not know what to do Care Advice Given Per Guideline CALL EMS 911 NOW: * Immediate medical attention is needed. You need to hang up and call 911 (or an ambulance). * Triager Discretion: I'll call you back in a few minutes to be sure you were able to reach them. CARE ADVICE given per Chest Pain (Adult) guideline.

## 2019-10-24 NOTE — Telephone Encounter (Signed)
Noted  

## 2019-10-24 NOTE — Telephone Encounter (Signed)
I spoke with pt and she has not gotten her medicare card yet and pt is going to work now. Pt advised last seen 01/14/2016 and pt would need to reestablish with  Glenda Chroman FNP, Avie Echevaria NP or Dr Einar Pheasant. [facility] office is scheduling the new pt appt at pts insistance. I spoke with Gentry Fitz NP who is not taking new pts now and advised that pt should go to UC or ED for eval. Pt said she is going to work now and guesses she will go to UC in the morning. UC & ED precautions given again and pt voiced understanding. 11/28/19 at 9:30 pt scheduled new pt appt with Glenda Chroman FNP per appt notes. FYI to Glenda Chroman FNP.

## 2019-11-28 ENCOUNTER — Ambulatory Visit: Payer: No Typology Code available for payment source | Admitting: Family Medicine

## 2019-12-10 ENCOUNTER — Other Ambulatory Visit: Payer: Self-pay

## 2019-12-10 ENCOUNTER — Encounter: Payer: Self-pay | Admitting: Family Medicine

## 2019-12-10 ENCOUNTER — Ambulatory Visit (INDEPENDENT_AMBULATORY_CARE_PROVIDER_SITE_OTHER): Payer: Medicare Other | Admitting: Family Medicine

## 2019-12-10 VITALS — BP 112/72 | HR 97 | Temp 97.7°F | Ht 61.0 in | Wt 175.0 lb

## 2019-12-10 DIAGNOSIS — E6609 Other obesity due to excess calories: Secondary | ICD-10-CM

## 2019-12-10 DIAGNOSIS — Z0000 Encounter for general adult medical examination without abnormal findings: Secondary | ICD-10-CM

## 2019-12-10 DIAGNOSIS — Z6833 Body mass index (BMI) 33.0-33.9, adult: Secondary | ICD-10-CM

## 2019-12-10 DIAGNOSIS — Z1211 Encounter for screening for malignant neoplasm of colon: Secondary | ICD-10-CM

## 2019-12-10 DIAGNOSIS — R7303 Prediabetes: Secondary | ICD-10-CM | POA: Diagnosis not present

## 2019-12-10 DIAGNOSIS — G4700 Insomnia, unspecified: Secondary | ICD-10-CM

## 2019-12-10 LAB — CBC WITH DIFFERENTIAL/PLATELET
Basophils Absolute: 0 10*3/uL (ref 0.0–0.1)
Basophils Relative: 0.7 % (ref 0.0–3.0)
Eosinophils Absolute: 0.3 10*3/uL (ref 0.0–0.7)
Eosinophils Relative: 4.3 % (ref 0.0–5.0)
HCT: 41.4 % (ref 36.0–46.0)
Hemoglobin: 13.8 g/dL (ref 12.0–15.0)
Lymphocytes Relative: 32.2 % (ref 12.0–46.0)
Lymphs Abs: 2.1 10*3/uL (ref 0.7–4.0)
MCHC: 33.3 g/dL (ref 30.0–36.0)
MCV: 89.1 fl (ref 78.0–100.0)
Monocytes Absolute: 0.5 10*3/uL (ref 0.1–1.0)
Monocytes Relative: 7.4 % (ref 3.0–12.0)
Neutro Abs: 3.6 10*3/uL (ref 1.4–7.7)
Neutrophils Relative %: 55.4 % (ref 43.0–77.0)
Platelets: 300 10*3/uL (ref 150.0–400.0)
RBC: 4.64 Mil/uL (ref 3.87–5.11)
RDW: 13.5 % (ref 11.5–15.5)
WBC: 6.4 10*3/uL (ref 4.0–10.5)

## 2019-12-10 LAB — COMPREHENSIVE METABOLIC PANEL
ALT: 12 U/L (ref 0–35)
AST: 15 U/L (ref 0–37)
Albumin: 4.2 g/dL (ref 3.5–5.2)
Alkaline Phosphatase: 58 U/L (ref 39–117)
BUN: 12 mg/dL (ref 6–23)
CO2: 32 mEq/L (ref 19–32)
Calcium: 8.9 mg/dL (ref 8.4–10.5)
Chloride: 102 mEq/L (ref 96–112)
Creatinine, Ser: 0.83 mg/dL (ref 0.40–1.20)
GFR: 81.84 mL/min (ref >60.00)
Glucose, Bld: 95 mg/dL (ref 70–99)
Potassium: 4 mEq/L (ref 3.5–5.1)
Sodium: 141 mEq/L (ref 135–145)
Total Bilirubin: 0.7 mg/dL (ref 0.2–1.2)
Total Protein: 6.8 g/dL (ref 6.0–8.3)

## 2019-12-10 LAB — LIPID PANEL
Cholesterol: 243 mg/dL — ABNORMAL HIGH (ref 0–200)
HDL: 53.1 mg/dL (ref >39.00)
LDL Cholesterol: 172 mg/dL — ABNORMAL HIGH (ref 0–99)
NonHDL: 189.4
Total CHOL/HDL Ratio: 5
Triglycerides: 89 mg/dL (ref 0.0–149.0)
VLDL: 17.8 mg/dL (ref 0.0–40.0)

## 2019-12-10 LAB — HEMOGLOBIN A1C: Hgb A1c MFr Bld: 5.6 % (ref 4.6–6.5)

## 2019-12-10 LAB — TSH: TSH: 1.59 u[IU]/mL (ref 0.35–4.50)

## 2019-12-10 NOTE — Patient Instructions (Addendum)
Good to meet you today  You will be called regarding lab results  Try getting out of bed a little earlier in the morning, don't stay in bed for more than 9 hours, try to get early morning sunlight, also see information below  You will get Cologuard kit in mail  Please call and schedule an appointment for screening mammogram. A referral is not needed.  [phone]    Insomnia Insomnia is a sleep disorder that makes it difficult to fall asleep or stay asleep. Insomnia can cause fatigue, low energy, difficulty concentrating, mood swings, and poor performance at work or school. There are three different ways to classify insomnia:  Difficulty falling asleep.  Difficulty staying asleep.  Waking up too early in the morning. Any type of insomnia can be long-term (chronic) or short-term (acute). Both are common. Short-term insomnia usually lasts for three months or less. Chronic insomnia occurs at least three times a week for longer than three months. What are the causes? Insomnia may be caused by another condition, situation, or substance, such as:  Anxiety.  Certain medicines.  Gastroesophageal reflux disease (GERD) or other gastrointestinal conditions.  Asthma or other breathing conditions.  Restless legs syndrome, sleep apnea, or other sleep disorders.  Chronic pain.  Menopause.  Stroke.  Abuse of alcohol, tobacco, or illegal drugs.  Mental health conditions, such as depression.  Caffeine.  Neurological disorders, such as Alzheimer's disease.  An overactive thyroid (hyperthyroidism). Sometimes, the cause of insomnia may not be known. What increases the risk? Risk factors for insomnia include:  Gender. Women are affected more often than men.  Age. Insomnia is more common as you get older.  Stress.  Lack of exercise.  Irregular work schedule or working night shifts.  Traveling between different time zones.  Certain  medical and mental health conditions. What are the signs or symptoms? If you have insomnia, the main symptom is having trouble falling asleep or having trouble staying asleep. This may lead to other symptoms, such as:  Feeling fatigued or having low energy.  Feeling nervous about going to sleep.  Not feeling rested in the morning.  Having trouble concentrating.  Feeling irritable, anxious, or depressed. How is this diagnosed? This condition may be diagnosed based on:  Your symptoms and medical history. Your health care provider may ask about: ? Your sleep habits. ? Any medical conditions you have. ? Your mental health.  A physical exam. How is this treated? Treatment for insomnia depends on the cause. Treatment may focus on treating an underlying condition that is causing insomnia. Treatment may also include:  Medicines to help you sleep.  Counseling or therapy.  Lifestyle adjustments to help you sleep better. Follow these instructions at home: Eating and drinking   Limit or avoid alcohol, caffeinated beverages, and cigarettes, especially close to bedtime. These can disrupt your sleep.  Do not eat a large meal or eat spicy foods right before bedtime. This can lead to digestive discomfort that can make it hard for you to sleep. Sleep habits   Keep a sleep diary to help you and your health care provider figure out what could be causing your insomnia. Write down: ? When you sleep. ? When you wake up during the night. ? How well you sleep. ? How rested you feel the next day. ? Any side effects of medicines you are taking. ? What you eat and drink.  Make your bedroom a dark, comfortable place where it is easy  to fall asleep. ? Put up shades or blackout curtains to block light from outside. ? Use a white noise machine to block noise. ? Keep the temperature cool.  Limit screen use before bedtime. This includes: ? Watching TV. ? Using your smartphone, tablet, or  computer.  Stick to a routine that includes going to bed and waking up at the same times every day and night. This can help you fall asleep faster. Consider making a quiet activity, such as reading, part of your nighttime routine.  Try to avoid taking naps during the day so that you sleep better at night.  Get out of bed if you are still awake after 15 minutes of trying to sleep. Keep the lights down, but try reading or doing a quiet activity. When you feel sleepy, go back to bed. General instructions  Take over-the-counter and prescription medicines only as told by your health care provider.  Exercise regularly, as told by your health care provider. Avoid exercise starting several hours before bedtime.  Use relaxation techniques to manage stress. Ask your health care provider to suggest some techniques that may work well for you. These may include: ? Breathing exercises. ? Routines to release muscle tension. ? Visualizing peaceful scenes.  Make sure that you drive carefully. Avoid driving if you feel very sleepy.  Keep all follow-up visits as told by your health care provider. This is important. Contact a health care provider if:  You are tired throughout the day.  You have trouble in your daily routine due to sleepiness.  You continue to have sleep problems, or your sleep problems get worse. Get help right away if:  You have serious thoughts about hurting yourself or someone else. If you ever feel like you may hurt yourself or others, or have thoughts about taking your own life, get help right away. You can go to your nearest emergency department or call:  Your local emergency services (911 in the [place]  A suicide crisis helpline, such as the [facility] at (480)727-0512. This is open 24 hours a day. Summary  Insomnia is a sleep disorder that makes it difficult to fall asleep or stay asleep.  Insomnia can be long-term (chronic) or short-term  (acute).  Treatment for insomnia depends on the cause. Treatment may focus on treating an underlying condition that is causing insomnia.  Keep a sleep diary to help you and your health care provider figure out what could be causing your insomnia. This information is not intended to replace advice given to you by your health care provider. Make sure you discuss any questions you have with your health care provider. Document Revised: 12/24/2016 Document Reviewed: 10/21/2016 Elsevier Patient Education  2020 [employer].

## 2019-12-10 NOTE — Progress Notes (Signed)
Subjective:    Patient ID: Janice Ali, female    DOB: [birth year], 49 y.o.   MRN: [id number]  HPI This is a 49 year old female who presents today to establish care and for complete physical.  Previously patient of Loma Boston, NP. Lives with a roommate. She works at [employer]. Enjoys gardening.     Last CPE- years ago Mammo- 01/20/2016 Pap- 01/14/2016 Colonoscopy- never screened  Tdap- 01/25/2006 Flu- never Covid 19 vaccine- not vaccinated Eye- 2 months ago Dental- occasionally Exercise- walks at work  Mental health- she is going to [facility], get prescriptions filled through them.   Difficulty with sleep- long standing, goes to bed 10:30-11, get up around 9- 9:930. Trouble going to sleep, staying asleep. Avoids electronics prior to bed. Avoids caffeine. No pain or nocturia. No hot flashes.     Review of Systems  Constitutional: Positive for fatigue.  HENT: Negative.   Eyes: Negative.   Respiratory: Negative.   Cardiovascular: Negative.   Gastrointestinal: Negative.   Endocrine: Negative.   Genitourinary: Negative.   Musculoskeletal: Negative.   Skin: Negative.   Allergic/Immunologic: Negative.   Neurological: Negative.   Hematological: Negative.   Psychiatric/Behavioral: Positive for sleep disturbance.      Objective:   Physical Exam Physical Exam  Constitutional: She is oriented to person, place, and time. She appears well-developed and well-nourished. No distress.  HENT:  Head: Normocephalic and atraumatic.  Right Ear: External ear normal. TM normal.  Left Ear: External ear normal. TM normal.  Nose: Nose normal.  Mouth/Throat: Oropharynx is clear and moist. No oropharyngeal exudate.  Eyes: Conjunctivae are normal.   Neck: Normal range of motion. Neck supple. No JVD present. No thyromegaly present.  Cardiovascular: Normal rate, regular rhythm, normal heart sounds and intact distal pulses.   Pulmonary/Chest: Effort normal and breath sounds  normal. Right breast exhibits no inverted nipple, no mass, no nipple discharge, no skin change and no tenderness. Left breast exhibits no inverted nipple, no mass, no nipple discharge, no skin change and no tenderness. Breasts are symmetrical. Well healed bilateral mammoplasty scars.  Abdominal: Soft. Bowel sounds are normal. She exhibits no distension and no mass. There is no tenderness. There is no rebound and no guarding.  Musculoskeletal: Normal range of motion. She exhibits no edema or tenderness.  Lymphadenopathy:    She has no cervical adenopathy.  Neurological: She is alert and oriented to person, place, and time.   Skin: Skin is warm and dry. She is not diaphoretic.  Psychiatric: She has a normal mood and affect. Her behavior is normal. Judgment and thought content normal.  Vitals reviewed.     BP 112/72   Pulse 97   Temp 97.7 F (36.5 C) (Temporal)   Ht 5\' 1"  (1.549 m)   Wt 175 lb (79.4 kg)   LMP  (LMP Unknown) Comment: irregular periods  SpO2 98%   BMI 33.07 kg/m  Wt Readings from Last 3 Encounters:  12/10/19 175 lb (79.4 kg)  01/14/16 183 lb (83 kg)  01/12/16 183 lb (83 kg)   BP Readings from Last 3 Encounters:  12/10/19 112/72  01/14/16 106/78  01/12/16 102/70   Depression screen PHQ 2/9 12/10/2019  Decreased Interest 1  Down, Depressed, Hopeless 1  PHQ - 2 Score 2  Altered sleeping 3  Tired, decreased energy 2  Change in appetite 0  Feeling bad or failure about yourself  1  Trouble concentrating 0  Moving slowly or fidgety/restless 0  Suicidal thoughts 0  PHQ-9 Score 8  Difficult doing work/chores Somewhat difficult   GAD 7 : Generalized Anxiety Score 12/10/2019  Nervous, Anxious, on Edge 1  Control/stop worrying 3  Worry too much - different things 3  Trouble relaxing 2  Restless 0  Easily annoyed or irritable 0  Afraid - awful might happen 1  Total GAD 7 Score 10        Assessment & Plan:  1. Annual physical exam - Discussed and encouraged  healthy lifestyle choices- adequate sleep, regular exercise, stress management and healthy food choices.  - provided her information to schedule mammogram  2. Class 1 obesity due to excess calories without serious comorbidity with body mass index (BMI) of 33.0 to 33.9 in adult - encouraged healthy food choices, avoid sugary beverages, sugars/ simple starches - Lipid Panel - Comprehensive metabolic panel - CBC with Differential - TSH - Hemoglobin A1c  3. Prediabetes - Lipid Panel - Comprehensive metabolic panel - TSH - Hemoglobin A1c  4. Screening for colon cancer - Cologuard  5. Insomnia, unspecified type - discussed sleep hygiene and provided written information, can also try low dose melatonin  This visit occurred during the SARS-CoV-2 public health emergency.  Safety protocols were in place, including screening questions prior to the visit, additional usage of staff PPE, and extensive cleaning of exam room while observing appropriate contact time as indicated for disinfecting solutions.      Clarene Reamer, FNP-BC  [facility], [facility]  12/11/2019 1:29 PM

## 2019-12-11 ENCOUNTER — Encounter: Payer: Self-pay | Admitting: Family Medicine

## 2019-12-18 ENCOUNTER — Telehealth: Payer: Self-pay

## 2019-12-18 NOTE — Telephone Encounter (Signed)
Received fax from PCP reporting cologuard received order but did not receive pt insurance info. They requested insurance info be faxed to [phone]. Printed order and insurance cards and faxed.

## 2020-01-04 ENCOUNTER — Telehealth: Payer: Self-pay
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
## 2020-01-04 NOTE — Telephone Encounter (Signed)
Noted. Agree with recommendations.  
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
## 2020-01-09 ENCOUNTER — Emergency Department
Admission: EM | Admit: 2020-01-09 | Discharge: 2020-01-09 | Disposition: A | Discharge status: Home / Self Care | Payer: No Typology Code available for payment source | Attending: Emergency Medicine | Admitting: Emergency Medicine

## 2020-01-09 ENCOUNTER — Other Ambulatory Visit: Payer: Self-pay

## 2020-01-09 ENCOUNTER — Emergency Department: Payer: No Typology Code available for payment source

## 2020-01-09 DIAGNOSIS — S0990XA Unspecified injury of head, initial encounter: Secondary | ICD-10-CM | POA: Diagnosis present

## 2020-01-09 DIAGNOSIS — S0083XA Contusion of other part of head, initial encounter: Secondary | ICD-10-CM | POA: Diagnosis not present

## 2020-01-09 DIAGNOSIS — Y92410 Unspecified street and highway as the place of occurrence of the external cause: Secondary | ICD-10-CM | POA: Insufficient documentation

## 2020-01-09 DIAGNOSIS — Z91040 Latex allergy status: Secondary | ICD-10-CM | POA: Diagnosis not present
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
## 2020-01-09 NOTE — ED Notes (Signed)
Pt denies abd pain, n/v/d, was dizzy at the time but denies further dizziness  Pt reports tenderness to back of head, no swelling noted. Denies pain to neck, back, legs. Some pain to hip when laying down. No pain with ambulation   No LOC
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
## 2020-02-25 ENCOUNTER — Telehealth: Payer: Self-pay

## 2020-02-25 NOTE — Telephone Encounter (Signed)
Received fax from [facility] stating that patient has not sent in specimen yet. Called and spoke with patient who stated that she plans to send in.

## 2020-02-26 DIAGNOSIS — F332 Major depressive disorder, recurrent severe without psychotic features: Secondary | ICD-10-CM | POA: Diagnosis not present

## 2020-02-26 DIAGNOSIS — G4700 Insomnia, unspecified: Secondary | ICD-10-CM | POA: Diagnosis not present

## 2020-02-26 DIAGNOSIS — F4310 Post-traumatic stress disorder, unspecified: Secondary | ICD-10-CM | POA: Diagnosis not present

## 2020-03-18 DIAGNOSIS — R7303 Prediabetes: Secondary | ICD-10-CM | POA: Diagnosis not present

## 2020-03-18 DIAGNOSIS — F319 Bipolar disorder, unspecified: Secondary | ICD-10-CM | POA: Diagnosis not present

## 2020-03-18 DIAGNOSIS — I499 Cardiac arrhythmia, unspecified: Secondary | ICD-10-CM | POA: Diagnosis not present

## 2020-03-18 DIAGNOSIS — R8290 Unspecified abnormal findings in urine: Secondary | ICD-10-CM | POA: Diagnosis not present

## 2020-03-18 DIAGNOSIS — Z7189 Other specified counseling: Secondary | ICD-10-CM | POA: Diagnosis not present

## 2020-03-18 DIAGNOSIS — Z6829 Body mass index (BMI) 29.0-29.9, adult: Secondary | ICD-10-CM | POA: Diagnosis not present

## 2020-03-18 DIAGNOSIS — Z87891 Personal history of nicotine dependence: Secondary | ICD-10-CM | POA: Diagnosis not present

## 2020-03-18 DIAGNOSIS — R0789 Other chest pain: Secondary | ICD-10-CM | POA: Diagnosis not present

## 2020-03-18 DIAGNOSIS — E663 Overweight: Secondary | ICD-10-CM | POA: Diagnosis not present

## 2020-03-19 DIAGNOSIS — F332 Major depressive disorder, recurrent severe without psychotic features: Secondary | ICD-10-CM | POA: Diagnosis not present

## 2020-03-19 DIAGNOSIS — G4700 Insomnia, unspecified: Secondary | ICD-10-CM | POA: Diagnosis not present

## 2020-03-19 DIAGNOSIS — F4310 Post-traumatic stress disorder, unspecified: Secondary | ICD-10-CM | POA: Diagnosis not present

## 2020-04-15 NOTE — Progress Notes (Unsigned)
Cardiology Office Note:    Date:  04/17/2020   ID:  Janice Ali, DOB [birth year], MRN [id number]  PCP:  Elby Beck, FNP (Inactive)   [place]  Cardiologist:  No primary care provider on file.  Advanced Practice Provider:  No care team member to display Electrophysiologist:  None   Referring MD: Michael Boston, MD    History of Present Illness:    Janice Ali is a 50 y.o. female with a hx of anxiety, depression and bipolar depression who was referred by Dr. Jacalyn Lefevre for further evaluation of chest tightness and palpitations.  The patient states that she has been having episodes of chest tightness for about 6 months. Symptoms are not exertional and usually come on at rest. Each episode lasts a couple of minutes before going away on it's own. Cannot predict when it is going to occur. No associated symptoms. No known history of CAD.  Also has been having intermittent palpitations where her heart feels like it may skip beats. Occurring about 1-2x/week. Lasts seconds and then goes away. Has some associated SOB. No LE edema, orthopnea or PND. Occasional dizziness. No syncope. 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   Family History with premature death at age 85 (suspected cardiac), dad passed away at 37, mother no heart issues.   Past Medical History:  Diagnosis Date  . Anxiety and depression   . Bipolar disorder ([place])   . Chlamydia   . Frequent headaches   . Melanoma ([place])   . Post traumatic stress disorder (PTSD)     Past Surgical History:  Procedure Laterality Date  . BREAST REDUCTION SURGERY Bilateral 2011  . CESAREAN SECTION  1995  . EYE SURGERY  1977  . REDUCTION MAMMAPLASTY  2011    Current Medications: Current Meds  Medication Sig  . busPIRone (BUSPAR) 5 MG tablet Take 5 mg by mouth 3 (three) times daily.  Marland Kitchen PARoxetine (PAXIL) 40 MG tablet Take 40 mg by mouth at bedtime.  . ziprasidone (GEODON)  40 MG capsule Take 40 mg by mouth 2 (two) times daily with a meal. Taking 40 mg in the morning and 80 mg at bedtime  . ziprasidone (GEODON) 80 MG capsule Take 80 mg by mouth 2 (two) times daily with a meal.  . zolpidem (AMBIEN) 5 MG tablet Take 1 tablet by mouth at bedtime.     Allergies:   Codeine and Latex   Social History   Socioeconomic History  . Marital status: Single    Spouse name: Not on file  . Number of children: Not on file  . Years of education: Not on file  . Highest education level: Not on file  Occupational History  . Not on file  Tobacco Use  . Smoking status: Never Smoker  . Smokeless tobacco: Never Used  Vaping Use  . Vaping Use: Never used  Substance and Sexual Activity  . Alcohol use: Yes  . Drug use: Never  . Sexual activity: Not Currently  Other Topics Concern  . Not on file  Social History Narrative   Single.   1 son.   Works in [employer].   Enjoys gardening.    Social Determinants of Health   Financial Resource Strain: Not on file  Food Insecurity: Not on file  Transportation Needs: Not on file  Physical Activity: Not on file  Stress: Not on file  Social Connections: Not on file  Family History: The patient's family history includes Alcohol abuse in her brother, brother, father, and mother; Depression in her mother and son; Drug abuse in her brother and brother; Early death in her brother, father, and sister; Hypertension in her mother; Hypothyroidism in her mother; Lung cancer in her maternal grandmother.  ROS:   Please see the history of present illness.    Review of Systems  Constitutional: Negative for chills and fever.  HENT: Negative for hearing loss.   Eyes: Negative for blurred vision and redness.  Respiratory: Positive for shortness of breath.   Cardiovascular: Positive for chest pain and palpitations. Negative for orthopnea, claudication, leg swelling and PND.  Gastrointestinal: Negative for melena, nausea and vomiting.   Genitourinary: Negative for dysuria and flank pain.  Musculoskeletal: Negative for myalgias.  Neurological: Negative for dizziness and loss of consciousness.  Endo/Heme/Allergies: Negative for polydipsia.  Psychiatric/Behavioral: The patient is nervous/anxious.     EKGs/Labs/Other Studies Reviewed:    The following studies were reviewed today: No cardiac studies  EKG:  EKG 01/09/20: NSR  Recent Labs: 12/10/2019: ALT 12; BUN 12; Creatinine, Ser 0.83; Hemoglobin 13.8; Platelets 300.0; Potassium 4.0; Sodium 141; TSH 1.59  Recent Lipid Panel    Component Value Date/Time   CHOL 243 (H) 12/10/2019 1159   TRIG 89.0 12/10/2019 1159   HDL 53.10 12/10/2019 1159   CHOLHDL 5 12/10/2019 1159   VLDL 17.8 12/10/2019 1159   LDLCALC 172 (H) 12/10/2019 1159     Physical Exam:    VS:  BP 100/70 (BP Location: Left Arm, Patient Position: Sitting, Cuff Size: Normal)   Pulse 95   Ht 5\' 1"  (1.549 m)   Wt 156 lb (70.8 kg)   SpO2 97%   BMI 29.48 kg/m     Wt Readings from Last 3 Encounters:  04/17/20 156 lb (70.8 kg)  01/09/20 180 lb (81.6 kg)  12/10/19 175 lb (79.4 kg)     GEN:  Well nourished, well developed in no acute distress HEENT: Normal NECK: No JVD; No carotid bruits CARDIAC: RRR, no murmurs, rubs, gallops RESPIRATORY:  Clear to auscultation without rales, wheezing or rhonchi  ABDOMEN: Soft, non-tender, non-distended MUSCULOSKELETAL:  No edema; No deformity  SKIN: Warm and dry NEUROLOGIC:  Alert and oriented x 3 PSYCHIATRIC:  Normal affect   ASSESSMENT:    1. Palpitations   2. Chest pain of uncertain etiology   3. Mixed hyperlipidemia   4. Anxiety   5. Bipolar disorder, in partial remission, most recent episode mixed ([place])    PLAN:    In order of problems listed above:  #Palpitations: Patient with several month history of intermittent palpitations and the sensation like her heart is skipping beats. Occurring 1-2x/week. Overall improving since seeing her PCP but  still bothersome.  -Check 7 day zio monitor  -Maintain good hydration -Avoid caffeine  -TSH normal  #Chest Tightness: Atypical and does not sound cardiac in nature. Has been ongoing for the last 32months. Symptoms are not exertional and the patient is active with no exercise limitations. No personal history of CAD. Likely related to anxiety which is overall improving with medications. Will continue to monitor at this time. Plan for risk stratification with coronary calcium score as below -Continue to monitor -Coronary calcium score as below  #HLD: LDL 176. Not on statins -Will risk stratify with coronary calcium score  #Anxiety:  #Bipolar disorder: -Management per psychiatry    Medication Adjustments/Labs and Tests Ordered: Current medicines are reviewed at length with the patient  today.  Concerns regarding medicines are outlined above.  Orders Placed This Encounter  Procedures  . CT CARDIAC SCORING (SELF PAY ONLY)  . LONG TERM MONITOR (3-14 DAYS)   No orders of the defined types were placed in this encounter.   Patient Instructions   Medication Instructions:  Your physician recommends that you continue on your current medications as directed. Please refer to the Current Medication list given to you today. If you need a refill on your cardiac medications before your next appointment, please call your pharmacy*    Testing/Procedures: Your provider has recommended a Calcium scoring test    Follow-Up: At [facility], you and your health needs are our priority.  As part of our continuing mission to provide you with exceptional heart care, we have created designated Provider Care Teams.  These Care Teams include your primary Cardiologist (physician) and Advanced Practice Providers (APPs -  Physician Assistants and Nurse Practitioners) who all work together to provide you with the care you need, when you need it.  We recommend signing up for the patient portal called  "MyChart".  Sign up information is provided on this After Visit Summary.  MyChart is used to connect with patients for Virtual Visits (Telemedicine).  Patients are able to view lab/test results, encounter notes, upcoming appointments, etc.  Non-urgent messages can be sent to your provider as well.   To learn more about what you can do with MyChart, go to [email or web address].    Your next appointment:   6 month(s)  The format for your next appointment:   In Person  Provider:   You will see one of the following Advanced Practice Providers on your designated Care Team:    Richardson Dopp, PA-C  Vin Bhagat, [place]    Other Instructions [place] Monitor Instructions   Your physician has requested you wear your ZIO patch monitor___7____days.   This is a single patch monitor.  [employer] supplies one patch monitor per enrollment.  Additional stickers are not available.   Please do not apply patch if you will be having a Nuclear Stress Test, Echocardiogram, Cardiac CT, MRI, or Chest Xray during the time frame you would be wearing the monitor. The patch cannot be worn during these tests.  You cannot remove and re-apply the ZIO XT patch monitor.   Your ZIO patch monitor will be sent USPS Priority mail from [facility] directly to your home address. The monitor may also be mailed to a PO BOX if home delivery is not available.   It may take 3-5 days to receive your monitor after you have been enrolled.   Once you have received you monitor, please review enclosed instructions.  Your monitor has already been registered assigning a specific monitor serial # to you.   Applying the monitor   Shave hair from upper left chest.   Hold abrader disc by orange tab.  Rub abrader in 40 strokes over left upper chest as indicated in your monitor instructions.   Clean area with 4 enclosed alcohol pads .  Use all pads to assure are is cleaned thoroughly.  Let dry.   Apply patch as indicated  in monitor instructions.  Patch will be place under collarbone on left side of chest with arrow pointing upward.   Rub patch adhesive wings for 2 minutes.Remove white label marked "1".  Remove white label marked "2".  Rub patch adhesive wings for 2 additional minutes.   While looking in a mirror, press and  release button in center of patch.  A small green light will flash 3-4 times .  This will be your only indicator the monitor has been turned on.     Do not shower for the first 24 hours.  You may shower after the first 24 hours.   Press button if you feel a symptom. You will hear a small click.  Record Date, Time and Symptom in the Patient Log Book.   When you are ready to remove patch, follow instructions on last 2 pages of Patient Log Book.  Stick patch monitor onto last page of Patient Log Book.   Place Patient Log Book in [place] box.  Use locking tab on box and tape box closed securely.  The [employer] has [employer] on it.  Please place in mailbox as soon as possible.  Your physician should have your test results approximately 7 days after the monitor has been mailed back to [facility].   Call [place] at [phone] if you have questions regarding your ZIO XT patch monitor.  Call them immediately if you see an orange light blinking on your monitor.   If your monitor falls off in less than 4 days contact our Monitor department at [phone].  If your monitor becomes loose or falls off after 4 days call [employer] at [phone] for suggestions on securing your monitor.    Mediterranean Diet A Mediterranean diet refers to food and lifestyle choices that are based on the traditions of countries located on the [employer]. This way of eating has been shown to help prevent certain conditions and improve outcomes for people who have chronic diseases, like kidney disease and heart disease. What are tips for following this plan? Lifestyle  Cook  and eat meals together with your family, when possible.  Drink enough fluid to keep your urine clear or pale yellow.  Be physically active every day. This includes: ? Aerobic exercise like running or swimming. ? Leisure activities like gardening, walking, or housework.  Get 7-8 hours of sleep each night.  If recommended by your health care provider, drink red wine in moderation. This means 1 glass a day for nonpregnant women and 2 glasses a day for men. A glass of wine equals 5 oz (150 mL). Reading food labels  Check the serving size of packaged foods. For foods such as rice and pasta, the serving size refers to the amount of cooked product, not dry.  Check the total fat in packaged foods. Avoid foods that have saturated fat or trans fats.  Check the ingredients list for added sugars, such as corn syrup.   Shopping  At the grocery store, buy most of your food from the areas near the walls of the store. This includes: ? Fresh fruits and vegetables (produce). ? Grains, beans, nuts, and seeds. Some of these may be available in unpackaged forms or large amounts (in bulk). ? Fresh seafood. ? Poultry and eggs. ? Low-fat dairy products.  Buy whole ingredients instead of prepackaged foods.  Buy fresh fruits and vegetables in-season from local farmers markets.  Buy frozen fruits and vegetables in resealable bags.  If you do not have access to quality fresh seafood, buy precooked frozen shrimp or canned fish, such as tuna, salmon, or sardines.  Buy small amounts of raw or cooked vegetables, salads, or olives from the deli or salad bar at your store.  Stock your pantry so you always have certain foods on hand, such  as olive oil, canned tuna, canned tomatoes, rice, pasta, and beans. Cooking  Cook foods with extra-virgin olive oil instead of using butter or other vegetable oils.  Have meat as a side dish, and have vegetables or grains as your main dish. This means having meat in small  portions or adding small amounts of meat to foods like pasta or stew.  Use beans or vegetables instead of meat in common dishes like chili or lasagna.  Experiment with different cooking methods. Try roasting or broiling vegetables instead of steaming or sauteing them.  Add frozen vegetables to soups, stews, pasta, or rice.  Add nuts or seeds for added healthy fat at each meal. You can add these to yogurt, salads, or vegetable dishes.  Marinate fish or vegetables using olive oil, lemon juice, garlic, and fresh herbs. Meal planning  Plan to eat 1 vegetarian meal one day each week. Try to work up to 2 vegetarian meals, if possible.  Eat seafood 2 or more times a week.  Have healthy snacks readily available, such as: ? Vegetable sticks with hummus. ? [place] yogurt. ? Fruit and nut trail mix.  Eat balanced meals throughout the week. This includes: ? Fruit: 2-3 servings a day ? Vegetables: 4-5 servings a day ? Low-fat dairy: 2 servings a day ? Fish, poultry, or lean meat: 1 serving a day ? Beans and legumes: 2 or more servings a week ? Nuts and seeds: 1-2 servings a day ? Whole grains: 6-8 servings a day ? Extra-virgin olive oil: 3-4 servings a day  Limit red meat and sweets to only a few servings a month   What are my food choices?  Mediterranean diet ? Recommended  Grains: Whole-grain pasta. Brown rice. Bulgar wheat. Polenta. Couscous. Whole-wheat bread. [place].  Vegetables: Artichokes. Beets. Broccoli. Cabbage. Carrots. Eggplant. Green beans. Chard. Kale. Spinach. Onions. Leeks. Peas. Squash. Tomatoes. Peppers. Radishes.  Fruits: Apples. Apricots. Avocado. Berries. Bananas. Cherries. Dates. Figs. Grapes. Lemons. Melon. Oranges. Peaches. Plums. Pomegranate.  Meats and other protein foods: Beans. Almonds. Sunflower seeds. Pine nuts. Peanuts. [place]. Salmon. Scallops. Shrimp. [place]. Tilapia. Clams. Oysters. Eggs.  Dairy: Low-fat milk. Cheese. Greek yogurt.  Beverages:  Water. Red wine. Herbal tea.  Fats and oils: Extra virgin olive oil. Avocado oil. Grape seed oil.  Sweets and desserts: [place] yogurt with honey. Baked apples. Poached pears. Trail mix.  Seasoning and other foods: Basil. Cilantro. Coriander. Cumin. Mint. Parsley. Sage. Rosemary. Tarragon. Garlic. Oregano. Thyme. Pepper. Balsalmic vinegar. Tahini. Hummus. Tomato sauce. Olives. Mushrooms. ? Limit these  Grains: Prepackaged pasta or rice dishes. Prepackaged cereal with added sugar.  Vegetables: Deep fried potatoes (french fries).  Fruits: Fruit canned in syrup.  Meats and other protein foods: Beef. Pork. Lamb. Poultry with skin. Hot dogs. Berniece Salines.  Dairy: Ice cream. Sour cream. Whole milk.  Beverages: Juice. Sugar-sweetened soft drinks. Beer. Liquor and spirits.  Fats and oils: Butter. Canola oil. Vegetable oil. Beef fat (tallow). Lard.  Sweets and desserts: Cookies. Cakes. Pies. Candy.  Seasoning and other foods: Mayonnaise. Premade sauces and marinades. The items listed may not be a complete list. Talk with your dietitian about what dietary choices are right for you. Summary  The Mediterranean diet includes both food and lifestyle choices.  Eat a variety of fresh fruits and vegetables, beans, nuts, seeds, and whole grains.  Limit the amount of red meat and sweets that you eat.  Talk with your health care provider about whether it is safe for you to drink red  wine in moderation. This means 1 glass a day for nonpregnant women and 2 glasses a day for men. A glass of wine equals 5 oz (150 mL). This information is not intended to replace advice given to you by your health care provider. Make sure you discuss any questions you have with your health care provider. Document Revised: 09/11/2015 Document Reviewed: 09/04/2015 Elsevier Patient Education  2020 [place], Freada Bergeron, MD  04/17/2020 12:42 PM    [place]

## 2020-04-16 ENCOUNTER — Telehealth: Payer: Self-pay

## 2020-04-16 NOTE — Telephone Encounter (Signed)
ERROR

## 2020-04-17 ENCOUNTER — Ambulatory Visit (INDEPENDENT_AMBULATORY_CARE_PROVIDER_SITE_OTHER): Payer: Self-pay

## 2020-04-17 ENCOUNTER — Ambulatory Visit: Payer: Self-pay | Admitting: Cardiology

## 2020-04-17 ENCOUNTER — Other Ambulatory Visit: Payer: Self-pay

## 2020-04-17 ENCOUNTER — Encounter: Payer: Self-pay | Admitting: Cardiology

## 2020-04-17 VITALS — BP 100/70 | HR 95 | Ht 61.0 in | Wt 156.0 lb

## 2020-04-17 DIAGNOSIS — R079 Chest pain, unspecified: Secondary | ICD-10-CM

## 2020-04-17 DIAGNOSIS — F419 Anxiety disorder, unspecified: Secondary | ICD-10-CM

## 2020-04-17 DIAGNOSIS — E782 Mixed hyperlipidemia: Secondary | ICD-10-CM

## 2020-04-17 DIAGNOSIS — R002 Palpitations: Secondary | ICD-10-CM

## 2020-04-17 DIAGNOSIS — F3177 Bipolar disorder, in partial remission, most recent episode mixed: Secondary | ICD-10-CM

## 2020-04-17 NOTE — Patient Instructions (Signed)
Medication Instructions:  Your physician recommends that you continue on your current medications as directed. Please refer to the Current Medication list given to you today. If you need a refill on your cardiac medications before your next appointment, please call your pharmacy*    Testing/Procedures: Your provider has recommended a Calcium scoring test    Follow-Up: At [facility], you and your health needs are our priority.  As part of our continuing mission to provide you with exceptional heart care, we have created designated Provider Care Teams.  These Care Teams include your primary Cardiologist (physician) and Advanced Practice Providers (APPs -  Physician Assistants and Nurse Practitioners) who all work together to provide you with the care you need, when you need it.  We recommend signing up for the patient portal called "MyChart".  Sign up information is provided on this After Visit Summary.  MyChart is used to connect with patients for Virtual Visits (Telemedicine).  Patients are able to view lab/test results, encounter notes, upcoming appointments, etc.  Non-urgent messages can be sent to your provider as well.   To learn more about what you can do with MyChart, go to [email or web address].    Your next appointment:   6 month(s)  The format for your next appointment:   In Person  Provider:   You will see one of the following Advanced Practice Providers on your designated Care Team:    Richardson Dopp, PA-C  Vin Bhagat, [place]    Other Instructions [place] Monitor Instructions   Your physician has requested you wear your ZIO patch monitor___7____days.   This is a single patch monitor.  [employer] supplies one patch monitor per enrollment.  Additional stickers are not available.   Please do not apply patch if you will be having a Nuclear Stress Test, Echocardiogram, Cardiac CT, MRI, or Chest Xray during the time frame you would be wearing the monitor. The  patch cannot be worn during these tests.  You cannot remove and re-apply the ZIO XT patch monitor.   Your ZIO patch monitor will be sent USPS Priority mail from [facility] directly to your home address. The monitor may also be mailed to a PO BOX if home delivery is not available.   It may take 3-5 days to receive your monitor after you have been enrolled.   Once you have received you monitor, please review enclosed instructions.  Your monitor has already been registered assigning a specific monitor serial # to you.   Applying the monitor   Shave hair from upper left chest.   Hold abrader disc by orange tab.  Rub abrader in 40 strokes over left upper chest as indicated in your monitor instructions.   Clean area with 4 enclosed alcohol pads .  Use all pads to assure are is cleaned thoroughly.  Let dry.   Apply patch as indicated in monitor instructions.  Patch will be place under collarbone on left side of chest with arrow pointing upward.   Rub patch adhesive wings for 2 minutes.Remove white label marked "1".  Remove white label marked "2".  Rub patch adhesive wings for 2 additional minutes.   While looking in a mirror, press and release button in center of patch.  A small green light will flash 3-4 times .  This will be your only indicator the monitor has been turned on.     Do not shower for the first 24 hours.  You may shower after the first 24 hours.  Press button if you feel a symptom. You will hear a small click.  Record Date, Time and Symptom in the Patient Log Book.   When you are ready to remove patch, follow instructions on last 2 pages of Patient Log Book.  Stick patch monitor onto last page of Patient Log Book.   Place Patient Log Book in [place] box.  Use locking tab on box and tape box closed securely.  The [employer] has [employer] on it.  Please place in mailbox as soon as possible.  Your physician should have your test results approximately 7 days  after the monitor has been mailed back to [facility].   Call [place] at [phone] if you have questions regarding your ZIO XT patch monitor.  Call them immediately if you see an orange light blinking on your monitor.   If your monitor falls off in less than 4 days contact our Monitor department at [phone].  If your monitor becomes loose or falls off after 4 days call [employer] at [phone] for suggestions on securing your monitor.    Mediterranean Diet A Mediterranean diet refers to food and lifestyle choices that are based on the traditions of countries located on the [employer]. This way of eating has been shown to help prevent certain conditions and improve outcomes for people who have chronic diseases, like kidney disease and heart disease. What are tips for following this plan? Lifestyle  Cook and eat meals together with your family, when possible.  Drink enough fluid to keep your urine clear or pale yellow.  Be physically active every day. This includes: ? Aerobic exercise like running or swimming. ? Leisure activities like gardening, walking, or housework.  Get 7-8 hours of sleep each night.  If recommended by your health care provider, drink red wine in moderation. This means 1 glass a day for nonpregnant women and 2 glasses a day for men. A glass of wine equals 5 oz (150 mL). Reading food labels  Check the serving size of packaged foods. For foods such as rice and pasta, the serving size refers to the amount of cooked product, not dry.  Check the total fat in packaged foods. Avoid foods that have saturated fat or trans fats.  Check the ingredients list for added sugars, such as corn syrup.   Shopping  At the grocery store, buy most of your food from the areas near the walls of the store. This includes: ? Fresh fruits and vegetables (produce). ? Grains, beans, nuts, and seeds. Some of these may be available in unpackaged forms or  large amounts (in bulk). ? Fresh seafood. ? Poultry and eggs. ? Low-fat dairy products.  Buy whole ingredients instead of prepackaged foods.  Buy fresh fruits and vegetables in-season from local farmers markets.  Buy frozen fruits and vegetables in resealable bags.  If you do not have access to quality fresh seafood, buy precooked frozen shrimp or canned fish, such as tuna, salmon, or sardines.  Buy small amounts of raw or cooked vegetables, salads, or olives from the deli or salad bar at your store.  Stock your pantry so you always have certain foods on hand, such as olive oil, canned tuna, canned tomatoes, rice, pasta, and beans. Cooking  Cook foods with extra-virgin olive oil instead of using butter or other vegetable oils.  Have meat as a side dish, and have vegetables or grains as your main dish. This means having meat in small portions or  adding small amounts of meat to foods like pasta or stew.  Use beans or vegetables instead of meat in common dishes like chili or lasagna.  Experiment with different cooking methods. Try roasting or broiling vegetables instead of steaming or sauteing them.  Add frozen vegetables to soups, stews, pasta, or rice.  Add nuts or seeds for added healthy fat at each meal. You can add these to yogurt, salads, or vegetable dishes.  Marinate fish or vegetables using olive oil, lemon juice, garlic, and fresh herbs. Meal planning  Plan to eat 1 vegetarian meal one day each week. Try to work up to 2 vegetarian meals, if possible.  Eat seafood 2 or more times a week.  Have healthy snacks readily available, such as: ? Vegetable sticks with hummus. ? [place] yogurt. ? Fruit and nut trail mix.  Eat balanced meals throughout the week. This includes: ? Fruit: 2-3 servings a day ? Vegetables: 4-5 servings a day ? Low-fat dairy: 2 servings a day ? Fish, poultry, or lean meat: 1 serving a day ? Beans and legumes: 2 or more servings a week ? Nuts and  seeds: 1-2 servings a day ? Whole grains: 6-8 servings a day ? Extra-virgin olive oil: 3-4 servings a day  Limit red meat and sweets to only a few servings a month   What are my food choices?  Mediterranean diet ? Recommended  Grains: Whole-grain pasta. Brown rice. Bulgar wheat. Polenta. Couscous. Whole-wheat bread. [place].  Vegetables: Artichokes. Beets. Broccoli. Cabbage. Carrots. Eggplant. Green beans. Chard. Kale. Spinach. Onions. Leeks. Peas. Squash. Tomatoes. Peppers. Radishes.  Fruits: Apples. Apricots. Avocado. Berries. Bananas. Cherries. Dates. Figs. Grapes. Lemons. Melon. Oranges. Peaches. Plums. Pomegranate.  Meats and other protein foods: Beans. Almonds. Sunflower seeds. Pine nuts. Peanuts. [place]. Salmon. Scallops. Shrimp. [place]. Tilapia. Clams. Oysters. Eggs.  Dairy: Low-fat milk. Cheese. Greek yogurt.  Beverages: Water. Red wine. Herbal tea.  Fats and oils: Extra virgin olive oil. Avocado oil. Grape seed oil.  Sweets and desserts: [place] yogurt with honey. Baked apples. Poached pears. Trail mix.  Seasoning and other foods: Basil. Cilantro. Coriander. Cumin. Mint. Parsley. Sage. Rosemary. Tarragon. Garlic. Oregano. Thyme. Pepper. Balsalmic vinegar. Tahini. Hummus. Tomato sauce. Olives. Mushrooms. ? Limit these  Grains: Prepackaged pasta or rice dishes. Prepackaged cereal with added sugar.  Vegetables: Deep fried potatoes (french fries).  Fruits: Fruit canned in syrup.  Meats and other protein foods: Beef. Pork. Lamb. Poultry with skin. Hot dogs. Berniece Salines.  Dairy: Ice cream. Sour cream. Whole milk.  Beverages: Juice. Sugar-sweetened soft drinks. Beer. Liquor and spirits.  Fats and oils: Butter. Canola oil. Vegetable oil. Beef fat (tallow). Lard.  Sweets and desserts: Cookies. Cakes. Pies. Candy.  Seasoning and other foods: Mayonnaise. Premade sauces and marinades. The items listed may not be a complete list. Talk with your dietitian about what dietary  choices are right for you. Summary  The Mediterranean diet includes both food and lifestyle choices.  Eat a variety of fresh fruits and vegetables, beans, nuts, seeds, and whole grains.  Limit the amount of red meat and sweets that you eat.  Talk with your health care provider about whether it is safe for you to drink red wine in moderation. This means 1 glass a day for nonpregnant women and 2 glasses a day for men. A glass of wine equals 5 oz (150 mL). This information is not intended to replace advice given to you by your health care provider. Make sure you discuss any questions you  have with your health care provider. Document Revised: 09/11/2015 Document Reviewed: 09/04/2015 Elsevier Patient Education  [place].

## 2020-05-05 DIAGNOSIS — R002 Palpitations: Secondary | ICD-10-CM

## 2020-05-07 ENCOUNTER — Encounter: Payer: Medicare Other | Admitting: Internal Medicine

## 2020-05-20 ENCOUNTER — Ambulatory Visit (INDEPENDENT_AMBULATORY_CARE_PROVIDER_SITE_OTHER)
Admission: RE | Admit: 2020-05-20 | Discharge: 2020-05-20 | Disposition: A | Discharge status: Home / Self Care | Payer: Self-pay | Source: Ambulatory Visit | Attending: Cardiology | Admitting: Cardiology

## 2020-05-20 ENCOUNTER — Other Ambulatory Visit: Payer: Self-pay

## 2020-05-20 DIAGNOSIS — R079 Chest pain, unspecified: Secondary | ICD-10-CM

## 2020-05-20 DIAGNOSIS — R002 Palpitations: Secondary | ICD-10-CM | POA: Diagnosis not present

## 2020-05-21 ENCOUNTER — Telehealth: Payer: Self-pay | Admitting: Cardiology

## 2020-05-21 DIAGNOSIS — I491 Atrial premature depolarization: Secondary | ICD-10-CM

## 2020-05-21 DIAGNOSIS — R002 Palpitations: Secondary | ICD-10-CM

## 2020-05-21 DIAGNOSIS — R9431 Abnormal electrocardiogram [ECG] [EKG]: Secondary | ICD-10-CM

## 2020-05-21 DIAGNOSIS — I493 Ventricular premature depolarization: Secondary | ICD-10-CM

## 2020-05-21 DIAGNOSIS — R079 Chest pain, unspecified: Secondary | ICD-10-CM

## 2020-05-21 NOTE — Telephone Encounter (Signed)
Patient is returning call to Dr. Jacolyn Reedy office

## 2020-05-21 NOTE — Telephone Encounter (Signed)
-----   Message from Sueanne Margarita, MD sent at 05/20/2020  6:27 PM EDT ----- Heart monitor showed a rare extra heart beat from the top and bottom of her heart which are benign.  BP too low to add BB or CCB.  Please get 2D echo to assess LVF

## 2020-05-21 NOTE — Telephone Encounter (Signed)
Pt made aware of monitor results and Calcium Score results, per Dr. Radford Pax, covering for Dr. Johney Frame.  Pt is aware that based on her monitor results, we need to order for her to have an echo done, to evaluate findings, and assess LVF.   Informed the pt that I will place the order for the echo in the system and send a message to our Echo [facility], to call her back and arrange this appt.   Pt states she is worried about the cost of the echo through her coverage, but will proceed with ordering this, and see how expensive it will be to have this done.  Will run concern by echo scheduler when she calls the pt.   Pt verbalized understanding and agrees with this plan.

## 2020-06-11 DIAGNOSIS — G4700 Insomnia, unspecified: Secondary | ICD-10-CM | POA: Diagnosis not present

## 2020-06-11 DIAGNOSIS — F4310 Post-traumatic stress disorder, unspecified: Secondary | ICD-10-CM | POA: Diagnosis not present

## 2020-06-11 DIAGNOSIS — F332 Major depressive disorder, recurrent severe without psychotic features: Secondary | ICD-10-CM | POA: Diagnosis not present

## 2020-06-17 ENCOUNTER — Ambulatory Visit (HOSPITAL_COMMUNITY): Payer: PPO | Attending: Cardiology

## 2020-06-17 ENCOUNTER — Other Ambulatory Visit: Payer: Self-pay

## 2020-06-17 DIAGNOSIS — I493 Ventricular premature depolarization: Secondary | ICD-10-CM

## 2020-06-17 DIAGNOSIS — R079 Chest pain, unspecified: Secondary | ICD-10-CM

## 2020-06-17 DIAGNOSIS — R002 Palpitations: Secondary | ICD-10-CM

## 2020-06-17 DIAGNOSIS — R9431 Abnormal electrocardiogram [ECG] [EKG]: Secondary | ICD-10-CM | POA: Diagnosis not present

## 2020-06-17 DIAGNOSIS — I491 Atrial premature depolarization: Secondary | ICD-10-CM

## 2020-06-17 LAB — ECHOCARDIOGRAM COMPLETE
Area-P 1/2: 5.84 cm2
S' Lateral: 2.8 cm

## 2020-06-19 ENCOUNTER — Encounter: Payer: Self-pay | Admitting: *Deleted

## 2020-06-19 ENCOUNTER — Telehealth: Payer: Self-pay | Admitting: *Deleted

## 2020-06-19 DIAGNOSIS — R072 Precordial pain: Secondary | ICD-10-CM

## 2020-06-19 DIAGNOSIS — R079 Chest pain, unspecified: Secondary | ICD-10-CM

## 2020-06-19 DIAGNOSIS — E782 Mixed hyperlipidemia: Secondary | ICD-10-CM

## 2020-06-19 DIAGNOSIS — R931 Abnormal findings on diagnostic imaging of heart and coronary circulation: Secondary | ICD-10-CM

## 2020-06-19 NOTE — Telephone Encounter (Signed)
Pt made aware of echo results and recommendations per Dr. Johney Frame.  Pt aware that Dr. Johney Frame is recommending she get a lexiscan done, to further assess blood flow to her heart. Informed the pt that I will place the order in the system and send a message to our [facility] scheduler, to call her back and arrange this test.  Janice Ali over Gem instructions with the pt over the phone.  Made her aware also that our schedulers will send her the instructions to prepare for this test, via her active mychart account, once scheduled. Pt verbalized understanding and and agrees with this plan.  Will send Dr. Johney Frame this message with pending attestation order, to sign off on.

## 2020-06-19 NOTE — Telephone Encounter (Signed)
-----   Message from Freada Bergeron, MD sent at 06/19/2020 11:29 AM EDT ----- Her echo shows normal pumping function. A very small portion of the muscle moves a little less vigorously than the other segments. Sometimes this may indicate abnormal blood flow to this region. To work this up further, we should do a stress test (myoview) to better assess the blood flow. Otherwise, her valves look good and her pressures are normal.

## 2020-06-24 ENCOUNTER — Encounter: Payer: Self-pay | Admitting: *Deleted

## 2020-06-26 ENCOUNTER — Telehealth (HOSPITAL_COMMUNITY): Payer: Self-pay | Admitting: *Deleted

## 2020-06-26 NOTE — Telephone Encounter (Signed)
Patient given detailed instructions per Myocardial Perfusion Study Information Sheet for the test on 07/03/20. Patient notified to arrive 15 minutes early and that it is imperative to arrive on time for appointment to keep from having the test rescheduled.  If you need to cancel or reschedule your appointment, please call the office within 24 hours of your appointment. . Patient verbalized understanding. Kirstie Peri

## 2020-07-03 ENCOUNTER — Ambulatory Visit (HOSPITAL_COMMUNITY): Payer: PPO | Attending: Internal Medicine

## 2020-07-03 ENCOUNTER — Other Ambulatory Visit: Payer: Self-pay

## 2020-07-03 DIAGNOSIS — R931 Abnormal findings on diagnostic imaging of heart and coronary circulation: Secondary | ICD-10-CM | POA: Diagnosis not present

## 2020-07-03 DIAGNOSIS — R079 Chest pain, unspecified: Secondary | ICD-10-CM

## 2020-07-03 DIAGNOSIS — R072 Precordial pain: Secondary | ICD-10-CM | POA: Diagnosis not present

## 2020-07-03 DIAGNOSIS — E782 Mixed hyperlipidemia: Secondary | ICD-10-CM | POA: Diagnosis not present

## 2020-07-03 LAB — MYOCARDIAL PERFUSION IMAGING
LV dias vol: 49 mL (ref 46–106)
LV sys vol: 14 mL
Peak HR: 100 {beats}/min
Rest HR: 72 {beats}/min
SDS: 0
SRS: 4
SSS: 4
TID: 0.94

## 2020-07-03 MED ORDER — REGADENOSON 0.4 MG/5ML IV SOLN
0.4000 mg | Freq: Once | INTRAVENOUS | Status: AC
  Administered 2020-07-03: 0.4 mg via INTRAVENOUS

## 2020-07-03 MED ORDER — TECHNETIUM TC 99M TETROFOSMIN IV KIT
30.9000 | PACK | Freq: Once | INTRAVENOUS | Status: AC | PRN
Start: 2020-07-03 — End: 2020-07-03
  Administered 2020-07-03: 30.9 via INTRAVENOUS
  Filled 2020-07-03: qty 31

## 2020-07-03 MED ORDER — TECHNETIUM TC 99M TETROFOSMIN IV KIT
10.5000 | PACK | Freq: Once | INTRAVENOUS | Status: AC | PRN
Start: 2020-07-03 — End: 2020-07-03
  Administered 2020-07-03: 10.5 via INTRAVENOUS
  Filled 2020-07-03: qty 11

## 2020-12-02 DIAGNOSIS — J069 Acute upper respiratory infection, unspecified: Secondary | ICD-10-CM | POA: Diagnosis not present

## 2020-12-02 DIAGNOSIS — Z20822 Contact with and (suspected) exposure to covid-19: Secondary | ICD-10-CM | POA: Diagnosis not present

## 2020-12-26 DIAGNOSIS — F319 Bipolar disorder, unspecified: Secondary | ICD-10-CM | POA: Diagnosis not present

## 2020-12-26 DIAGNOSIS — G4700 Insomnia, unspecified: Secondary | ICD-10-CM | POA: Diagnosis not present

## 2020-12-26 DIAGNOSIS — F209 Schizophrenia, unspecified: Secondary | ICD-10-CM | POA: Diagnosis not present

## 2020-12-29 DIAGNOSIS — F333 Major depressive disorder, recurrent, severe with psychotic symptoms: Secondary | ICD-10-CM | POA: Diagnosis not present

## 2020-12-29 DIAGNOSIS — G4700 Insomnia, unspecified: Secondary | ICD-10-CM | POA: Diagnosis not present

## 2021-03-20 DIAGNOSIS — F259 Schizoaffective disorder, unspecified: Secondary | ICD-10-CM | POA: Diagnosis not present

## 2021-03-20 DIAGNOSIS — F4310 Post-traumatic stress disorder, unspecified: Secondary | ICD-10-CM | POA: Diagnosis not present

## 2021-03-20 DIAGNOSIS — G4700 Insomnia, unspecified: Secondary | ICD-10-CM | POA: Diagnosis not present

## 2021-04-16 DIAGNOSIS — G4700 Insomnia, unspecified: Secondary | ICD-10-CM | POA: Diagnosis not present

## 2021-04-17 DIAGNOSIS — Z23 Encounter for immunization: Secondary | ICD-10-CM | POA: Diagnosis not present

## 2021-04-17 DIAGNOSIS — Z130 Encounter for screening for diseases of the blood and blood-forming organs and certain disorders involving the immune mechanism: Secondary | ICD-10-CM | POA: Diagnosis not present

## 2021-04-17 DIAGNOSIS — Z13220 Encounter for screening for lipoid disorders: Secondary | ICD-10-CM | POA: Diagnosis not present

## 2021-04-17 DIAGNOSIS — Z0001 Encounter for general adult medical examination with abnormal findings: Secondary | ICD-10-CM | POA: Diagnosis not present

## 2021-04-17 DIAGNOSIS — Z1211 Encounter for screening for malignant neoplasm of colon: Secondary | ICD-10-CM | POA: Diagnosis not present

## 2021-04-17 DIAGNOSIS — F419 Anxiety disorder, unspecified: Secondary | ICD-10-CM | POA: Diagnosis not present

## 2021-04-17 DIAGNOSIS — Z114 Encounter for screening for human immunodeficiency virus [HIV]: Secondary | ICD-10-CM | POA: Diagnosis not present

## 2021-04-17 DIAGNOSIS — R002 Palpitations: Secondary | ICD-10-CM | POA: Diagnosis not present

## 2021-04-17 DIAGNOSIS — Z1231 Encounter for screening mammogram for malignant neoplasm of breast: Secondary | ICD-10-CM | POA: Diagnosis not present

## 2021-04-17 DIAGNOSIS — R0789 Other chest pain: Secondary | ICD-10-CM | POA: Diagnosis not present

## 2021-04-17 DIAGNOSIS — Z13228 Encounter for screening for other metabolic disorders: Secondary | ICD-10-CM | POA: Diagnosis not present

## 2021-04-17 DIAGNOSIS — F319 Bipolar disorder, unspecified: Secondary | ICD-10-CM | POA: Diagnosis not present

## 2021-04-17 DIAGNOSIS — Z1159 Encounter for screening for other viral diseases: Secondary | ICD-10-CM | POA: Diagnosis not present

## 2021-04-17 DIAGNOSIS — F5101 Primary insomnia: Secondary | ICD-10-CM | POA: Diagnosis not present

## 2021-06-24 DIAGNOSIS — F39 Unspecified mood [affective] disorder: Secondary | ICD-10-CM | POA: Diagnosis not present

## 2021-07-07 DIAGNOSIS — F39 Unspecified mood [affective] disorder: Secondary | ICD-10-CM | POA: Diagnosis not present

## 2021-07-17 DIAGNOSIS — Z6828 Body mass index (BMI) 28.0-28.9, adult: Secondary | ICD-10-CM | POA: Diagnosis not present

## 2021-07-17 DIAGNOSIS — R87618 Other abnormal cytological findings on specimens from cervix uteri: Secondary | ICD-10-CM | POA: Diagnosis not present

## 2021-07-17 DIAGNOSIS — Z789 Other specified health status: Secondary | ICD-10-CM | POA: Diagnosis not present

## 2021-07-17 DIAGNOSIS — Z1151 Encounter for screening for human papillomavirus (HPV): Secondary | ICD-10-CM | POA: Diagnosis not present

## 2021-07-17 DIAGNOSIS — Z124 Encounter for screening for malignant neoplasm of cervix: Secondary | ICD-10-CM | POA: Diagnosis not present

## 2021-07-17 DIAGNOSIS — Z01419 Encounter for gynecological examination (general) (routine) without abnormal findings: Secondary | ICD-10-CM | POA: Diagnosis not present

## 2021-07-17 DIAGNOSIS — Z113 Encounter for screening for infections with a predominantly sexual mode of transmission: Secondary | ICD-10-CM | POA: Diagnosis not present

## 2021-08-04 DIAGNOSIS — F39 Unspecified mood [affective] disorder: Secondary | ICD-10-CM | POA: Diagnosis not present

## 2021-09-08 DIAGNOSIS — F39 Unspecified mood [affective] disorder: Secondary | ICD-10-CM | POA: Diagnosis not present

## 2021-10-06 DIAGNOSIS — F39 Unspecified mood [affective] disorder: Secondary | ICD-10-CM | POA: Diagnosis not present

## 2021-10-22 DIAGNOSIS — E669 Obesity, unspecified: Secondary | ICD-10-CM | POA: Diagnosis not present

## 2021-10-22 DIAGNOSIS — F10120 Alcohol abuse with intoxication, uncomplicated: Secondary | ICD-10-CM | POA: Diagnosis not present

## 2021-10-22 DIAGNOSIS — J9811 Atelectasis: Secondary | ICD-10-CM | POA: Diagnosis not present

## 2021-10-22 DIAGNOSIS — D649 Anemia, unspecified: Secondary | ICD-10-CM | POA: Diagnosis not present

## 2021-10-22 DIAGNOSIS — F319 Bipolar disorder, unspecified: Secondary | ICD-10-CM | POA: Diagnosis not present

## 2021-10-22 DIAGNOSIS — E872 Acidosis, unspecified: Secondary | ICD-10-CM | POA: Diagnosis not present

## 2021-10-22 DIAGNOSIS — R000 Tachycardia, unspecified: Secondary | ICD-10-CM | POA: Diagnosis not present

## 2021-10-22 DIAGNOSIS — G928 Other toxic encephalopathy: Secondary | ICD-10-CM | POA: Diagnosis not present

## 2021-10-22 DIAGNOSIS — T510X1A Toxic effect of ethanol, accidental (unintentional), initial encounter: Secondary | ICD-10-CM | POA: Diagnosis not present

## 2021-10-22 DIAGNOSIS — A419 Sepsis, unspecified organism: Secondary | ICD-10-CM | POA: Diagnosis not present

## 2021-10-22 DIAGNOSIS — R680 Hypothermia, not associated with low environmental temperature: Secondary | ICD-10-CM | POA: Diagnosis not present

## 2021-10-22 DIAGNOSIS — F1410 Cocaine abuse, uncomplicated: Secondary | ICD-10-CM | POA: Diagnosis not present

## 2021-10-22 DIAGNOSIS — G9341 Metabolic encephalopathy: Secondary | ICD-10-CM | POA: Diagnosis not present

## 2021-10-22 DIAGNOSIS — R092 Respiratory arrest: Secondary | ICD-10-CM | POA: Diagnosis not present

## 2021-10-22 DIAGNOSIS — J690 Pneumonitis due to inhalation of food and vomit: Secondary | ICD-10-CM | POA: Diagnosis not present

## 2021-10-22 DIAGNOSIS — T405X4A Poisoning by cocaine, undetermined, initial encounter: Secondary | ICD-10-CM | POA: Diagnosis not present
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
